# Patient Record
Sex: Female | Born: 1970
Health system: Southern US, Community
[De-identification: ages and names within clinical notes are randomized; demographics above are authoritative.]

## PROBLEM LIST (undated history)

## (undated) DIAGNOSIS — N189 Chronic kidney disease, unspecified: Secondary | ICD-10-CM

## (undated) DIAGNOSIS — F988 Other specified behavioral and emotional disorders with onset usually occurring in childhood and adolescence: Secondary | ICD-10-CM

## (undated) HISTORY — PX: APPENDECTOMY: SHX54

---
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
## 1997-08-25 ENCOUNTER — Inpatient Hospital Stay (HOSPITAL_COMMUNITY): Admission: AD | Admit: 1997-08-25 | Discharge: 1997-08-25 | Payer: Self-pay | Admitting: Obstetrics and Gynecology

## 1997-08-26 ENCOUNTER — Inpatient Hospital Stay (HOSPITAL_COMMUNITY): Admission: AD | Admit: 1997-08-26 | Discharge: 1997-08-28 | Payer: Self-pay | Admitting: Obstetrics and Gynecology

## 1997-09-27 ENCOUNTER — Other Ambulatory Visit: Admission: RE | Admit: 1997-09-27 | Discharge: 1997-09-27 | Payer: Self-pay | Admitting: Obstetrics and Gynecology

## 1998-10-13 ENCOUNTER — Other Ambulatory Visit: Admission: RE | Admit: 1998-10-13 | Discharge: 1998-10-13 | Payer: Self-pay | Admitting: Obstetrics and Gynecology

## 1999-11-28 ENCOUNTER — Other Ambulatory Visit: Admission: RE | Admit: 1999-11-28 | Discharge: 1999-11-28 | Payer: Self-pay | Admitting: Obstetrics and Gynecology

## 2001-04-07 ENCOUNTER — Other Ambulatory Visit: Admission: RE | Admit: 2001-04-07 | Discharge: 2001-04-07 | Payer: Self-pay | Admitting: Obstetrics and Gynecology

## 2002-10-07 ENCOUNTER — Other Ambulatory Visit: Admission: RE | Admit: 2002-10-07 | Discharge: 2002-10-07 | Payer: Self-pay | Admitting: Obstetrics and Gynecology

## 2003-12-06 ENCOUNTER — Other Ambulatory Visit: Admission: RE | Admit: 2003-12-06 | Discharge: 2003-12-06 | Payer: Self-pay | Admitting: Obstetrics and Gynecology

## 2004-09-04 ENCOUNTER — Inpatient Hospital Stay (HOSPITAL_COMMUNITY): Admission: AD | Admit: 2004-09-04 | Discharge: 2004-09-06 | Payer: Self-pay | Admitting: Obstetrics and Gynecology

## 2004-10-13 ENCOUNTER — Other Ambulatory Visit: Admission: RE | Admit: 2004-10-13 | Discharge: 2004-10-13 | Payer: Self-pay | Admitting: Obstetrics and Gynecology

## 2007-03-31 ENCOUNTER — Inpatient Hospital Stay (HOSPITAL_COMMUNITY): Admission: AD | Admit: 2007-03-31 | Discharge: 2007-03-31 | Payer: Self-pay | Admitting: Obstetrics and Gynecology

## 2007-08-05 ENCOUNTER — Inpatient Hospital Stay (HOSPITAL_COMMUNITY): Admission: AD | Admit: 2007-08-05 | Discharge: 2007-08-05 | Payer: Self-pay | Admitting: Obstetrics and Gynecology

## 2007-08-06 ENCOUNTER — Inpatient Hospital Stay (HOSPITAL_COMMUNITY): Admission: AD | Admit: 2007-08-06 | Discharge: 2007-08-07 | Payer: Self-pay | Admitting: Obstetrics and Gynecology

## 2007-08-06 ENCOUNTER — Encounter: Payer: Self-pay | Admitting: Obstetrics and Gynecology

## 2007-09-30 ENCOUNTER — Inpatient Hospital Stay (HOSPITAL_COMMUNITY): Admission: AD | Admit: 2007-09-30 | Discharge: 2007-10-01 | Payer: Self-pay | Admitting: Obstetrics and Gynecology

## 2007-10-23 ENCOUNTER — Ambulatory Visit (HOSPITAL_COMMUNITY): Admission: RE | Admit: 2007-10-23 | Discharge: 2007-10-23 | Payer: Self-pay | Admitting: Urology

## 2008-04-23 HISTORY — PX: ABLATION: SHX5711

## 2009-03-10 ENCOUNTER — Encounter: Payer: Self-pay | Admitting: Emergency Medicine

## 2009-03-10 ENCOUNTER — Ambulatory Visit: Payer: Self-pay | Admitting: Radiology

## 2009-03-10 ENCOUNTER — Observation Stay (HOSPITAL_COMMUNITY): Admission: RE | Admit: 2009-03-10 | Discharge: 2009-03-11 | Payer: Self-pay | Admitting: General Surgery

## 2009-03-11 ENCOUNTER — Encounter (INDEPENDENT_AMBULATORY_CARE_PROVIDER_SITE_OTHER): Payer: Self-pay | Admitting: General Surgery

## 2010-07-26 LAB — CBC
MCHC: 34 g/dL (ref 30.0–36.0)
MCV: 88.9 fL (ref 78.0–100.0)
Platelets: 233 10*3/uL (ref 150–400)
RDW: 12.1 % (ref 11.5–15.5)

## 2010-07-26 LAB — COMPREHENSIVE METABOLIC PANEL
AST: 19 U/L (ref 0–37)
Albumin: 4.3 g/dL (ref 3.5–5.2)
Calcium: 9.2 mg/dL (ref 8.4–10.5)
Creatinine, Ser: 1.1 mg/dL (ref 0.4–1.2)
GFR calc Af Amer: >60 mL/min (ref >60)
GFR calc non Af Amer: 56 mL/min — ABNORMAL LOW (ref >60)
Total Protein: 7.1 g/dL (ref 6.0–8.3)

## 2010-07-26 LAB — URINALYSIS, ROUTINE W REFLEX MICROSCOPIC
Glucose, UA: NEGATIVE mg/dL
Hgb urine dipstick: NEGATIVE
Protein, ur: NEGATIVE mg/dL
Specific Gravity, Urine: 1.018 (ref 1.005–1.030)

## 2010-07-26 LAB — RPR: RPR Ser Ql: NONREACTIVE

## 2010-07-26 LAB — DIFFERENTIAL
Basophils Relative: 6 % — ABNORMAL HIGH (ref 0–1)
Eosinophils Relative: 0 % (ref 0–5)
Lymphs Abs: 1 10*3/uL (ref 0.7–4.0)
Monocytes Relative: 6 % (ref 3–12)
Neutro Abs: 14.1 10*3/uL — ABNORMAL HIGH (ref 1.7–7.7)
Smear Review: NORMAL

## 2010-07-26 LAB — GC/CHLAMYDIA PROBE AMP, GENITAL: Chlamydia, DNA Probe: NEGATIVE

## 2010-07-26 LAB — WET PREP, GENITAL

## 2010-08-23 ENCOUNTER — Emergency Department (HOSPITAL_BASED_OUTPATIENT_CLINIC_OR_DEPARTMENT_OTHER)
Admission: EM | Admit: 2010-08-23 | Discharge: 2010-08-23 | Disposition: A | Discharge status: Home / Self Care | Payer: BC Managed Care – PPO | Attending: Emergency Medicine | Admitting: Emergency Medicine

## 2010-08-23 ENCOUNTER — Emergency Department (INDEPENDENT_AMBULATORY_CARE_PROVIDER_SITE_OTHER): Payer: BC Managed Care – PPO

## 2010-08-23 DIAGNOSIS — Y92009 Unspecified place in unspecified non-institutional (private) residence as the place of occurrence of the external cause: Secondary | ICD-10-CM | POA: Insufficient documentation

## 2010-08-23 DIAGNOSIS — M7989 Other specified soft tissue disorders: Secondary | ICD-10-CM

## 2010-08-23 DIAGNOSIS — M79609 Pain in unspecified limb: Secondary | ICD-10-CM

## 2010-08-23 DIAGNOSIS — W108XXA Fall (on) (from) other stairs and steps, initial encounter: Secondary | ICD-10-CM | POA: Insufficient documentation

## 2010-08-23 DIAGNOSIS — S90129A Contusion of unspecified lesser toe(s) without damage to nail, initial encounter: Secondary | ICD-10-CM | POA: Insufficient documentation

## 2010-09-01 ENCOUNTER — Other Ambulatory Visit: Payer: Self-pay | Admitting: Obstetrics and Gynecology

## 2010-09-05 NOTE — Discharge Summary (Signed)
Lisa Hayes, Lisa Hayes                   ACCOUNT NO.:  [id number]   MEDICAL RECORD NO.:  [id number]          PATIENT TYPE:  INP   LOCATION:  9159                          FACILITY:  [facility]   PHYSICIAN:  Juluis Mire, M.D.   DATE OF BIRTH:  [birth year]   DATE OF ADMISSION:  08/06/2007  DATE OF DISCHARGE:  08/07/2007                               DISCHARGE SUMMARY   ADMITTING DIAGNOSES:  Intrauterine pregnancy at 58 and 4/7th with right  lower quadrant pain.   DISCHARGE DIAGNOSES:  Intrauterine pregnancy at 36 and 4/7th with right  lower quadrant pain.   For complete history and physical, see dictated note.   COURSE IN THE HOSPITAL:  The patient was brought in.  She underwent an  MRI.  There was no evidence that it was an appendicitis.  She had an  obstetrical ultrasound that was unremarkable.  She had a biophysical  profile 8/8.  Subsequently, was kept in the hospital overnight.  Her  white count went from 19,000 to 16,000.  She remained afebrile with  stable vital signs.  On the morning of August 07, 2007, she again was  afebrile and tolerating her diet.  She states the pain had improved.  She still had some right lower quadrant tenderness over the uterus with  no peritoneal signs.  Bowel sounds were active.  She was having normal  bowel function and normal bladder function.  The only thing noted on the  MRI was slight hydronephrosis on the right, could be pregnancy related.  We are going to discharge her home at this time.   In terms of complications, none was indicated during her stay in the  hospital.  The patient was discharged in stable condition.   DISPOSITION:  We had going to go ahead and send her home on a course of  Augmentin for possible inflammatory causes related to the kidney and  bladder.  She is also sent home with Tylox needed for pain.  She is  instructed to call with increasing fever, pain, nausea, vomiting, or any  change in symptomatology.  We will plan to follow her  up in the office  tomorrow.      Juluis Mire, M.D.  Electronically Signed     JSM/MEDQ  D:  08/07/2007  T:  08/07/2007  Job:  [id number]

## 2010-09-08 NOTE — Op Note (Signed)
Lisa Hayes, Lisa Hayes                   ACCOUNT NO.:  [id number]   MEDICAL RECORD NO.:  [id number]          PATIENT TYPE:  INP   LOCATION:  9174                          FACILITY:  [facility]   PHYSICIAN:  Guy Sandifer. Henderson Cloud, M.D. DATE OF BIRTH:  1970/06/26   DATE OF PROCEDURE:  09/05/2004  DATE OF DISCHARGE:                                 OPERATIVE REPORT   PROCEDURE:  Vacuum extraction.   INDICATIONS AND CONSENT:  This patient is a 40 year old married white  female, G3, P2, EDC of Sep 08, 2004, placing her at 39-1/2 weeks' estimated  gestational age.  She presents to labor and delivery for a scheduled two-  stage induction.  She is having spontaneous contractions.  Epidural is  placed.  Spontaneous rupture of membranes for clear fluid is noted.  When  she progresses to complete dilation, she has a one to two-minute  deceleration in the 50s to 60s.  Oxygen is applied.  Heart rate recovers and  the baby is reactive.  She begins the second stage.  She pushes to a +3  station.  Occiput anterior.  Heart rates in the 50s to the 60s.  Vacuum  extraction with a one in 40,000 chance of severe morbidity or mortality is  discussed with the patient and her husband to shorten the second stage in  view of the bradycardia.  The Foley catheter has just been removed.  The  Kiwi vacuum extractor was placed on the vertex.  Over the course of one  contraction with no pop-offs and an easy pull, the vertex is delivered.  The  oropharynx and nasopharynx are suctioned.  A nuchal cord x1 is reduced.  The  remainder of the baby is delivered.  A good cry and tone is noted.  A viable  female infant, Apgars of 8 and 9 at one and five minutes, respectively, is  obtained.  Arterial cord pH of 7.20 is noted, and a birth weight is pending.  Placenta is three vessels and intact.  Cervix and vagina are without  laceration.  A second degree midline episiotomy is repaired.  The patient  and infant are stable in the labor and delivery  room.      JET/MEDQ  D:  09/05/2004  T:  09/05/2004  Job:  [id number]

## 2010-09-08 NOTE — H&P (Signed)
Lisa Hayes, FAIDLEY                   ACCOUNT NO.:  [id number]   MEDICAL RECORD NO.:  [id number]           PATIENT TYPE:   LOCATION:                                 FACILITY:   PHYSICIAN:  Duke Salvia. Marcelle Overlie, M.D.    DATE OF BIRTH:   DATE OF ADMISSION:  DATE OF DISCHARGE:                                HISTORY & PHYSICAL   CHIEF COMPLAINT:  For labor induction at term.   HISTORY OF PRESENT ILLNESS:  A 40 year old, G3, P2-0-0-2, EDD Sep 08, 2004,  who presents at term with favorable cervix for induction.  She has had an  uncomplicated prenatal course, history of positive group B strep in the  past.  She has had mild gestational hypertension.  Recently, her NST and BPP  were normal, dated Aug 24, 2004.   PAST MEDICAL HISTORY:  Blood type is O positive, rubella titer is immune.   OBSTETRICAL HISTORY:  She has had 2 vaginal deliveries at term, the first  complicated by PIH.   REVIEW OF SYSTEMS:  Significant for pyelonephritis in 1990.   FAMILY HISTORY:  Heart disease.  Father has had an MI.  Both parents have  hypertension.   PHYSICAL EXAMINATION:  VITAL SIGNS:  Temperature 98.2, blood pressure  130/70.  HEENT:  Unremarkable.  NECK:  Supple without masses.  LUNGS:  Clear.  CARDIOVASCULAR:  Regular rate and rhythm without murmurs, rubs, or gallops  noted.  BREASTS:  Not examined.  ABDOMEN:  Term fundal height.  Fetal heart rate 140s.  PELVIC:  Cervix was 2, 50%, -3, vertex.  EXTREMITIES:  1+ edema.  Reflexes 1 to 2+.  No clonus.   IMPRESSION:  1.  Term intrauterine pregnancy.  2.  Mild gestational hypertension.   PLAN:  Will admit for 2-stage labor induction.  Will require prophylactic  antibiotics in labor due to her group B strep history.      RMH/MEDQ  D:  09/04/2004  T:  09/04/2004  Job:  [id number]

## 2011-01-16 LAB — LACTATE DEHYDROGENASE: LDH: 100

## 2011-01-16 LAB — COMPREHENSIVE METABOLIC PANEL
AST: 15
Albumin: 2.9 — ABNORMAL LOW
BUN: 7
CO2: 23
Calcium: 8.8
Creatinine, Ser: 0.8
GFR calc Af Amer: >60
GFR calc non Af Amer: >60

## 2011-01-16 LAB — CBC
Hemoglobin: 10.5 — ABNORMAL LOW
MCHC: 35
MCHC: 35.9
MCV: 91.2
Platelets: 155
RBC: 3.26 — ABNORMAL LOW
WBC: 16 — ABNORMAL HIGH

## 2011-01-16 LAB — DIFFERENTIAL
Eosinophils Relative: 0
Lymphocytes Relative: 5 — ABNORMAL LOW
Lymphocytes Relative: 7 — ABNORMAL LOW
Lymphs Abs: 0.9
Lymphs Abs: 1.1
Monocytes Absolute: 0.9
Monocytes Relative: 6
Neutro Abs: 13.9 — ABNORMAL HIGH
Neutro Abs: 17.7 — ABNORMAL HIGH
Neutrophils Relative %: 87 — ABNORMAL HIGH

## 2011-01-16 LAB — URINALYSIS, ROUTINE W REFLEX MICROSCOPIC
Bilirubin Urine: NEGATIVE
Glucose, UA: NEGATIVE
Hgb urine dipstick: NEGATIVE
Protein, ur: NEGATIVE
Urobilinogen, UA: 0.2
pH: 5.5

## 2011-01-16 LAB — URINE CULTURE: Colony Count: >=100000

## 2011-01-16 LAB — URIC ACID: Uric Acid, Serum: 4

## 2011-01-18 LAB — CBC
HCT: 28.9 — ABNORMAL LOW
HCT: 32 — ABNORMAL LOW
HCT: 35.6 — ABNORMAL LOW
Hemoglobin: 12.6
MCHC: 35.4
MCV: 89.4
MCV: 90.1
Platelets: 119 — ABNORMAL LOW
Platelets: 127 — ABNORMAL LOW
RBC: 3.95
RDW: 14
RDW: 14.1
RDW: 14.5
WBC: 16.9 — ABNORMAL HIGH
WBC: 18.6 — ABNORMAL HIGH

## 2011-01-18 LAB — RPR: RPR Ser Ql: NONREACTIVE

## 2011-01-18 LAB — COMPREHENSIVE METABOLIC PANEL
ALT: 12
BUN: 7
CO2: 24
Calcium: 8.5
GFR calc non Af Amer: >60
Glucose, Bld: 71
Total Protein: 5.5 — ABNORMAL LOW

## 2011-01-18 LAB — URINALYSIS, ROUTINE W REFLEX MICROSCOPIC
Glucose, UA: NEGATIVE
Hgb urine dipstick: NEGATIVE
Ketones, ur: NEGATIVE
Protein, ur: NEGATIVE
Urobilinogen, UA: 0.2

## 2011-01-18 LAB — LACTATE DEHYDROGENASE: LDH: 131

## 2011-01-29 LAB — CBC
HCT: 39.8
Hemoglobin: 14
RBC: 4.57
RDW: 12.5
WBC: 11 — ABNORMAL HIGH

## 2011-01-29 LAB — URINALYSIS, ROUTINE W REFLEX MICROSCOPIC
Nitrite: NEGATIVE
Specific Gravity, Urine: 1.025
Urobilinogen, UA: 0.2
pH: 6

## 2011-01-29 LAB — BASIC METABOLIC PANEL
Calcium: 9.2
GFR calc Af Amer: >60
GFR calc non Af Amer: >60
Glucose, Bld: 92
Potassium: 4
Sodium: 134 — ABNORMAL LOW

## 2012-08-13 ENCOUNTER — Other Ambulatory Visit: Payer: Self-pay

## 2014-07-21 ENCOUNTER — Other Ambulatory Visit: Payer: Self-pay | Admitting: Obstetrics and Gynecology

## 2014-07-23 LAB — CYTOLOGY - PAP

## 2015-11-12 ENCOUNTER — Emergency Department (HOSPITAL_BASED_OUTPATIENT_CLINIC_OR_DEPARTMENT_OTHER): Payer: BLUE CROSS/BLUE SHIELD

## 2015-11-12 ENCOUNTER — Emergency Department (HOSPITAL_BASED_OUTPATIENT_CLINIC_OR_DEPARTMENT_OTHER)
Admission: EM | Admit: 2015-11-12 | Discharge: 2015-11-12 | Disposition: A | Discharge status: Home / Self Care | Payer: BLUE CROSS/BLUE SHIELD | Attending: Emergency Medicine | Admitting: Emergency Medicine

## 2015-11-12 ENCOUNTER — Encounter (HOSPITAL_BASED_OUTPATIENT_CLINIC_OR_DEPARTMENT_OTHER): Payer: Self-pay | Admitting: *Deleted

## 2015-11-12 DIAGNOSIS — Z79899 Other long term (current) drug therapy: Secondary | ICD-10-CM | POA: Diagnosis not present

## 2015-11-12 DIAGNOSIS — N2889 Other specified disorders of kidney and ureter: Secondary | ICD-10-CM | POA: Diagnosis not present

## 2015-11-12 DIAGNOSIS — R109 Unspecified abdominal pain: Secondary | ICD-10-CM | POA: Diagnosis present

## 2015-11-12 DIAGNOSIS — N159 Renal tubulo-interstitial disease, unspecified: Secondary | ICD-10-CM

## 2015-11-12 LAB — COMPREHENSIVE METABOLIC PANEL
ALBUMIN: 3.3 g/dL — AB (ref 3.5–5.0)
ALT: 19 U/L (ref 14–54)
AST: 16 U/L (ref 15–41)
Alkaline Phosphatase: 69 U/L (ref 38–126)
Anion gap: 8 (ref 5–15)
BILIRUBIN TOTAL: 1 mg/dL (ref 0.3–1.2)
BUN: 10 mg/dL (ref 6–20)
CHLORIDE: 101 mmol/L (ref 101–111)
CO2: 25 mmol/L (ref 22–32)
CREATININE: 1.16 mg/dL — AB (ref 0.44–1.00)
Calcium: 8.6 mg/dL — ABNORMAL LOW (ref 8.9–10.3)
GFR calc Af Amer: >60 mL/min (ref >60)
GFR, EST NON AFRICAN AMERICAN: 56 mL/min — AB (ref >60)
GLUCOSE: 102 mg/dL — AB (ref 65–99)
Potassium: 3.7 mmol/L (ref 3.5–5.1)
Sodium: 134 mmol/L — ABNORMAL LOW (ref 135–145)
Total Protein: 6.6 g/dL (ref 6.5–8.1)

## 2015-11-12 LAB — URINALYSIS, ROUTINE W REFLEX MICROSCOPIC
GLUCOSE, UA: NEGATIVE mg/dL
KETONES UR: 40 mg/dL — AB
Nitrite: NEGATIVE
PH: 6.5 (ref 5.0–8.0)
Protein, ur: 100 mg/dL — AB
Specific Gravity, Urine: 1.027 (ref 1.005–1.030)

## 2015-11-12 LAB — URINE MICROSCOPIC-ADD ON

## 2015-11-12 LAB — CBC WITH DIFFERENTIAL/PLATELET
BASOS ABS: 0 10*3/uL (ref 0.0–0.1)
BASOS PCT: 0 %
Eosinophils Absolute: 0 10*3/uL (ref 0.0–0.7)
Eosinophils Relative: 0 %
HEMATOCRIT: 38.6 % (ref 36.0–46.0)
Hemoglobin: 13.2 g/dL (ref 12.0–15.0)
LYMPHS PCT: 6 %
Lymphs Abs: 0.8 10*3/uL (ref 0.7–4.0)
MCH: 30.6 pg (ref 26.0–34.0)
MCHC: 34.2 g/dL (ref 30.0–36.0)
MCV: 89.6 fL (ref 78.0–100.0)
Monocytes Absolute: 1.2 10*3/uL — ABNORMAL HIGH (ref 0.1–1.0)
Monocytes Relative: 9 %
NEUTROS ABS: 11.1 10*3/uL — AB (ref 1.7–7.7)
NEUTROS PCT: 85 %
Platelets: 186 10*3/uL (ref 150–400)
RBC: 4.31 MIL/uL (ref 3.87–5.11)
RDW: 12.3 % (ref 11.5–15.5)
WBC: 13.1 10*3/uL — AB (ref 4.0–10.5)

## 2015-11-12 LAB — LIPASE, BLOOD: LIPASE: 14 U/L (ref 11–51)

## 2015-11-12 LAB — PREGNANCY, URINE: Preg Test, Ur: NEGATIVE

## 2015-11-12 MED ORDER — MORPHINE SULFATE (PF) 4 MG/ML IV SOLN
4.0000 mg | Freq: Once | INTRAVENOUS | Status: AC
  Administered 2015-11-12: 4 mg via INTRAVENOUS
  Filled 2015-11-12: qty 1

## 2015-11-12 MED ORDER — KETOROLAC TROMETHAMINE 30 MG/ML IJ SOLN
30.0000 mg | Freq: Once | INTRAMUSCULAR | Status: AC
  Administered 2015-11-12: 30 mg via INTRAVENOUS
  Filled 2015-11-12: qty 1

## 2015-11-12 MED ORDER — SODIUM CHLORIDE 0.9 % IV BOLUS (SEPSIS)
1000.0000 mL | Freq: Once | INTRAVENOUS | Status: AC
  Administered 2015-11-12: 1000 mL via INTRAVENOUS

## 2015-11-12 MED ORDER — DEXTROSE 5 % IV SOLN
1.0000 g | Freq: Once | INTRAVENOUS | Status: AC
  Administered 2015-11-12: 1 g via INTRAVENOUS
  Filled 2015-11-12: qty 10

## 2015-11-12 MED ORDER — SULFAMETHOXAZOLE-TRIMETHOPRIM 800-160 MG PO TABS: 1.0000 | ORAL_TABLET | Freq: Two times a day (BID) | ORAL | Status: AC

## 2015-11-12 NOTE — ED Provider Notes (Signed)
CSN: [id number]     Arrival date & time 11/12/15  1315 History   First MD Initiated Contact with Patient 11/12/15 1340     Chief Complaint  Patient presents with  . Flank Pain     (Consider location/radiation/quality/duration/timing/severity/associated sxs/prior Treatment) HPI   Patient is a 45 year old female who presents to the ED with right flank pain, onset 5 days. Patient reports 6 days ago she began having dysuria and notes the following day she began to have right-sided abdominal pain radiating around to her right flank. Patient reports having a waxing and waning sharp pain, pain is worse with movement. Patient reports having similar pain in the past when she was diagnosed with a kidney infection over 8 years ago. She notes she was seen by a nurse at her work 4 days ago, was diagnosed with a UTI and started on Cipro. She states she has been taking 500 mg of Cipro twice daily for the past 3-1/2 days. Patient endorses associated hematuria, urinary frequency, nausea and fever (temp 100.9 at home). Patient states she has been taking ibuprofen and an old prescription of hydrocodone at home with mild intermittent relief. Denies chills, cough, shortness of breath, chest pain, vomiting, diarrhea, constipation, vaginal bleeding, vaginal discharge, blood in stool, urinary retention. Endorses history of appendectomy and endometrial ablation. Patient reports her last menstrual cycle was in 2010 prior to having her endometrial ablation performed.  History reviewed. No pertinent past medical history. History reviewed. No pertinent past surgical history. History reviewed. No pertinent family history. Social History  Substance Use Topics  . Smoking status: Never Smoker   . Smokeless tobacco: None  . Alcohol Use: None   OB History    No data available     Review of Systems  Constitutional: Positive for fever.  Gastrointestinal: Positive for nausea and abdominal pain.  Genitourinary: Positive for  dysuria, frequency, hematuria and flank pain (right).  All other systems reviewed and are negative.     Allergies  Review of patient's allergies indicates no known allergies.  Home Medications   Prior to Admission medications   Medication Sig Start Date End Date Taking? Authorizing Provider  lisdexamfetamine (VYVANSE) 40 MG capsule Take 40 mg by mouth every morning.   Yes Historical Provider, MD  sulfamethoxazole-trimethoprim (BACTRIM DS,SEPTRA DS) 800-160 MG tablet Take 1 tablet by mouth 2 (two) times daily. 11/12/15 11/19/15  Satira Sark Nadeau, PA-C   BP 110/57 mmHg  Pulse 106  Temp(Src) 98.4 F (36.9 C) (Oral)  Resp 18  Ht  (1.727 m)  Wt 83.915 kg  BMI 28.14 kg/m2  SpO2 99% Physical Exam  Constitutional: She is oriented to person, place, and time. She appears well-developed and well-nourished. No distress.  HENT:  Head: Normocephalic and atraumatic.  Mouth/Throat: Oropharynx is clear and moist. No oropharyngeal exudate.  Eyes: Conjunctivae and EOM are normal. Right eye exhibits no discharge. Left eye exhibits no discharge. No scleral icterus.  Neck: Normal range of motion. Neck supple.  Cardiovascular: Normal rate, regular rhythm, normal heart sounds and intact distal pulses.   HR 88  Pulmonary/Chest: Effort normal and breath sounds normal. No respiratory distress. She has no wheezes. She has no rales. She exhibits no tenderness.  Abdominal: Soft. Bowel sounds are normal. She exhibits no distension and no mass. There is tenderness in the right upper quadrant and right lower quadrant. There is CVA tenderness (right). There is no rigidity, no rebound and no guarding.  Musculoskeletal: Normal range of motion. She exhibits  no edema.  Neurological: She is alert and oriented to person, place, and time.  Skin: Skin is warm and dry. She is not diaphoretic.  Nursing note and vitals reviewed.   ED Course  Procedures (including critical care time) Labs Review Labs  Reviewed  URINALYSIS, ROUTINE W REFLEX MICROSCOPIC (NOT AT [facility]) - Abnormal; Notable for the following:    Color, Urine AMBER (*)    APPearance CLOUDY (*)    Hgb urine dipstick LARGE (*)    Bilirubin Urine SMALL (*)    Ketones, ur 40 (*)    Protein, ur 100 (*)    Leukocytes, UA LARGE (*)    All other components within normal limits  URINE MICROSCOPIC-ADD ON - Abnormal; Notable for the following:    Squamous Epithelial / LPF 0-5 (*)    Bacteria, UA MANY (*)    All other components within normal limits  CBC WITH DIFFERENTIAL/PLATELET - Abnormal; Notable for the following:    WBC 13.1 (*)    Neutro Abs 11.1 (*)    Monocytes Absolute 1.2 (*)    All other components within normal limits  COMPREHENSIVE METABOLIC PANEL - Abnormal; Notable for the following:    Sodium 134 (*)    Glucose, Bld 102 (*)    Creatinine, Ser 1.16 (*)    Calcium 8.6 (*)    Albumin 3.3 (*)    GFR calc non Af Amer 56 (*)    All other components within normal limits  URINE CULTURE  PREGNANCY, URINE  LIPASE, BLOOD    Imaging Review Ct Renal Stone Study  11/12/2015  CLINICAL DATA:  Patient with right flank pain and hematuria for 2 days. EXAM: CT ABDOMEN AND PELVIS WITHOUT CONTRAST TECHNIQUE: Multidetector CT imaging of the abdomen and pelvis was performed following the standard protocol without IV contrast. COMPARISON:  CT abdomen pelvis 03/10/2009. FINDINGS: Lower chest: Normal heart size. Subpleural ground-glass opacities within the bilateral lower lobes compatible with atelectasis. No pleural effusion. Hepatobiliary: Liver is normal in size and contour. Liver is diffusely low in attenuation compatible with hepatic steatosis. Gallbladder is unremarkable. No intrahepatic or extrahepatic biliary dilatation. Pancreas: Unremarkable Spleen: Unremarkable Adrenals/Urinary Tract: Normal adrenal glands. The left kidney is normal in size and contour. No left-sided hydronephrosis. Urinary bladder is decompressed. The right  kidney is atrophic. There is marked dilatation of the right renal collecting system. Extensive fat stranding surrounding the right renal collecting system, right kidney and right ureter. No right-sided ureterolithiasis identified. Stomach/Bowel: No abnormal bowel wall thickening or evidence for bowel obstruction. No free intraperitoneal air. Normal morphology of the stomach. Vascular/Lymphatic: Normal caliber abdominal aorta. No retroperitoneal lymphadenopathy. Other: Uterus adnexal structures are unremarkable. Small amount of free fluid in the pelvis. Musculoskeletal: No aggressive or acute appearing osseous lesions. IMPRESSION: There is marked dilatation of the right renal collecting system, suggestive of UPJ obstruction. There is a significant amount of fat stranding about the right renal collecting system, right kidney and right ureter raising the possibility of superimposed infectious process. Causative etiology for the UPJ obstruction is not identified on current examination. Transitional cell carcinoma needs to be excluded as a causative etiology with multi phase contrast-enhanced CT as well as urinalysis. Hepatic steatosis. Electronically Signed   By: Annia Belt M.D.   On: 11/12/2015 15:49   I have personally reviewed and evaluated these images and lab results as part of my medical decision-making.   EKG Interpretation None      MDM   Final diagnoses:  Obstruction  of kidney  Kidney infection    Patient presents with dysuria and associated right flank and right-sided abdominal pain for the past 5 days. Endorses associated fever and hematuria. Patient was diagnosed with UTI 3 days ago and started on Cipro. She reports having worsening right flank pain after starting antibiotic. VSS. Exam revealed mild right upper and lower quadrant abdominal pain, right CVA tenderness, no peritoneal signs. Remaining exam unremarkable. Patient given IV fluids and Toradol in the ED. Urine revealed large  hemoglobin, large leukocytes, too numerous to count WBCs and many bacteria; consistent with infection. Pregnancy negative. WBC 13.1. Cr mildly elevated at 1.13 (baseline 1.1-0.79). On reevaluation patient reports her pain has mildly improved. CT renal study showed UPJ obstruction with stranding and right renal collecting system suggesting superimposed infectious process. Consult to urology. Dr. Mena Goes advised to give patient Rocephin in the ED and discharge patient home on Bactrim with plan to D/C Cipro. Plan to have patient call urology clinic Monday morning to schedule a follow-up appointment for next week. Patient able to tolerate  PO.   Evaluation does not show pathology requring ongoing emergent intervention or admission. Pt is hemodynamically stable and mentating appropriately. Discussed findings/results and plan with patient/guardian, who agrees with plan. All questions answered. Strict return precautions discussed and outpatient follow up given.      Satira Sark [place], [place] 11/12/15 1652   Nelva Nay, MD 11/13/15 [phone]

## 2015-11-12 NOTE — ED Notes (Signed)
Pt reports right flank pain and dysuria x Tuesday evening.

## 2015-11-12 NOTE — Discharge Instructions (Signed)
Take your antibiotic prescription as prescribed until finished.. Taking your prescription of Ciprofloxacin. Continue drinking fluids to remain hydrated at home. Call the Urology clinic listed above on Monday to schedule a follow up appointment. Please return to the Emergency Department if symptoms worsen or new onset of fever, worsening abdominal pain, vomiting, unable to keep fluids down, unable to urinate, blood in urine.

## 2015-11-14 LAB — URINE CULTURE: CULTURE: NO GROWTH

## 2015-11-15 ENCOUNTER — Emergency Department (HOSPITAL_BASED_OUTPATIENT_CLINIC_OR_DEPARTMENT_OTHER)
Admission: EM | Admit: 2015-11-15 | Discharge: 2015-11-15 | Disposition: A | Discharge status: Home / Self Care | Payer: BLUE CROSS/BLUE SHIELD | Attending: Emergency Medicine | Admitting: Emergency Medicine

## 2015-11-15 ENCOUNTER — Encounter (HOSPITAL_BASED_OUTPATIENT_CLINIC_OR_DEPARTMENT_OTHER): Payer: Self-pay | Admitting: *Deleted

## 2015-11-15 DIAGNOSIS — M549 Dorsalgia, unspecified: Secondary | ICD-10-CM | POA: Insufficient documentation

## 2015-11-15 DIAGNOSIS — R509 Fever, unspecified: Secondary | ICD-10-CM | POA: Insufficient documentation

## 2015-11-15 DIAGNOSIS — R109 Unspecified abdominal pain: Secondary | ICD-10-CM | POA: Diagnosis not present

## 2015-11-15 LAB — URINALYSIS, ROUTINE W REFLEX MICROSCOPIC
Bilirubin Urine: NEGATIVE
GLUCOSE, UA: NEGATIVE mg/dL
Hgb urine dipstick: NEGATIVE
KETONES UR: NEGATIVE mg/dL
LEUKOCYTES UA: NEGATIVE
NITRITE: NEGATIVE
PH: 6 (ref 5.0–8.0)
Protein, ur: NEGATIVE mg/dL
Specific Gravity, Urine: 1.022 (ref 1.005–1.030)

## 2015-11-15 LAB — BASIC METABOLIC PANEL
ANION GAP: 8 (ref 5–15)
BUN: 12 mg/dL (ref 6–20)
CALCIUM: 8.5 mg/dL — AB (ref 8.9–10.3)
CO2: 23 mmol/L (ref 22–32)
Chloride: 104 mmol/L (ref 101–111)
Creatinine, Ser: 0.96 mg/dL (ref 0.44–1.00)
Glucose, Bld: 84 mg/dL (ref 65–99)
POTASSIUM: 3.9 mmol/L (ref 3.5–5.1)
Sodium: 135 mmol/L (ref 135–145)

## 2015-11-15 LAB — CBC WITH DIFFERENTIAL/PLATELET
BLASTS: 0 %
Band Neutrophils: 3 %
Basophils Absolute: 0.1 10*3/uL (ref 0.0–0.1)
Basophils Relative: 1 %
Eosinophils Absolute: 0.1 10*3/uL (ref 0.0–0.7)
Eosinophils Relative: 1 %
HEMATOCRIT: 37.9 % (ref 36.0–46.0)
Hemoglobin: 12.9 g/dL (ref 12.0–15.0)
LYMPHS ABS: 1.5 10*3/uL (ref 0.7–4.0)
LYMPHS PCT: 18 %
MCH: 30.3 pg (ref 26.0–34.0)
MCHC: 34 g/dL (ref 30.0–36.0)
MCV: 89 fL (ref 78.0–100.0)
MONOS PCT: 10 %
Metamyelocytes Relative: 1 %
Monocytes Absolute: 0.8 10*3/uL (ref 0.1–1.0)
Myelocytes: 0 %
NEUTROS ABS: 5.9 10*3/uL (ref 1.7–7.7)
NEUTROS PCT: 66 %
NRBC: 0 /100{WBCs}
PROMYELOCYTES ABS: 0 %
Platelets: 247 10*3/uL (ref 150–400)
RBC: 4.26 MIL/uL (ref 3.87–5.11)
RDW: 12.2 % (ref 11.5–15.5)
WBC: 8.4 10*3/uL (ref 4.0–10.5)

## 2015-11-15 LAB — PREGNANCY, URINE: Preg Test, Ur: NEGATIVE

## 2015-11-15 MED ORDER — POLYETHYLENE GLYCOL 3350 17 GM/SCOOP PO POWD: 17.0000 g | Freq: Two times a day (BID) | ORAL | 1 refills | Status: DC

## 2015-11-15 MED ORDER — ONDANSETRON 4 MG PO TBDP: 4.0000 mg | ORAL_TABLET | Freq: Three times a day (TID) | ORAL | 0 refills | Status: DC | PRN

## 2015-11-15 MED ORDER — ONDANSETRON HCL 4 MG/2ML IJ SOLN
4.0000 mg | Freq: Once | INTRAMUSCULAR | Status: AC
  Administered 2015-11-15: 4 mg via INTRAVENOUS
  Filled 2015-11-15: qty 2

## 2015-11-15 MED ORDER — MORPHINE SULFATE (PF) 4 MG/ML IV SOLN
4.0000 mg | Freq: Once | INTRAVENOUS | Status: AC
  Administered 2015-11-15: 4 mg via INTRAVENOUS
  Filled 2015-11-15: qty 1

## 2015-11-15 MED ORDER — HYDROCODONE-ACETAMINOPHEN 5-325 MG PO TABS: 1.0000 | ORAL_TABLET | Freq: Four times a day (QID) | ORAL | 0 refills | Status: DC | PRN

## 2015-11-15 MED ORDER — SODIUM CHLORIDE 0.9 % IV BOLUS (SEPSIS)
1000.0000 mL | Freq: Once | INTRAVENOUS | Status: AC
  Administered 2015-11-15: 1000 mL via INTRAVENOUS

## 2015-11-15 MED FILL — HYDROCODON-APAP 5-325: 5-325 | 2 days supply | Qty: 15 | Fill #0

## 2015-11-15 MED FILL — POLYETHYLENE GLYCOL 3350 PO: 14 days supply | Qty: 476 | Fill #0

## 2015-11-15 MED FILL — ONDANSETRON ODT 4 MG TABLET: 4 | 4 days supply | Qty: 9 | Fill #0

## 2015-11-15 NOTE — ED Triage Notes (Signed)
Pt seen here 3 days ago fro same, today c/o lower back pain and no improvement

## 2015-11-15 NOTE — ED Notes (Signed)
Pt made aware to return if symptoms worsen or if any life threatening symptoms occur.  Pt made aware not to drive or go to work while taking narcotic pain medication.    

## 2015-11-15 NOTE — ED Provider Notes (Signed)
[facility] Provider Note   CSN: [id number] Arrival date & time: 11/15/15  1343  First Provider Contact:  First MD Initiated Contact with Patient 11/15/15 1426        History   Chief Complaint Chief Complaint  Patient presents with  . Back Pain    HPI Lisa Hayes is a 45 y.o. female.  Lisa Hayes is a 45 y.o. Female who presents to the ED complaining of ongoing right flank pain. The patient was seen here 3 days prior and diagnosed with a urinary tract infection and UPJ obstruction. Patient reports she has a long history of right kidney issues and has been told by her urologist Dr. Patsi Sears that she has less than 3% right kidney function. Patient reports she has been having 8 days now of right flank pain. She was originally started on Cipro by her nurse at work and then switched to Bactrim after her emergency department visit. She reports she has follow-up with urology tomorrow. She reports she is continue to have right flank pain and this prompted her to come to the emergency department today. She reports she's been taking Tylenol and ibuprofen intermittently with some relief of her pain. She currently complains of 6 out of 10 right flank pain. She denies any dysuria, hematuria, urinary frequency or urinary urgency currently. She has been tolerating by mouth. She reports some low-grade fevers at home. No antipyretics in more than 6 hours. No fever on arrival to the emergency department. Patient denies nausea, vomiting, diarrhea, current urinary symptoms, abdominal pain, coughing or trouble breathing.   The history is provided by the patient and medical records. No language interpreter was used.  Back Pain   Associated symptoms include a fever. Pertinent negatives include no chest pain, no headaches, no abdominal pain and no dysuria.    History reviewed. No pertinent past medical history.  There are no active problems to display for this patient.   History reviewed. No  pertinent surgical history.  OB History    No data available       Home Medications    Prior to Admission medications   Medication Sig Start Date End Date Taking? Authorizing Provider  HYDROcodone-acetaminophen (NORCO/VICODIN) 5-325 MG tablet Take 1-2 tablets by mouth every 6 (six) hours as needed for moderate pain. 11/15/15   Everlene Farrier, PA-C  lisdexamfetamine (VYVANSE) 40 MG capsule Take 40 mg by mouth every morning.    Historical Provider, MD  ondansetron (ZOFRAN ODT) 4 MG disintegrating tablet Take 1 tablet (4 mg total) by mouth every 8 (eight) hours as needed for nausea or vomiting. 11/15/15   Everlene Farrier, PA-C  polyethylene glycol powder (GLYCOLAX/MIRALAX) powder Take 17 g by mouth 2 (two) times daily. Until daily soft stools  OTC 11/15/15   Everlene Farrier, PA-C  sulfamethoxazole-trimethoprim (BACTRIM DS,SEPTRA DS) 800-160 MG tablet Take 1 tablet by mouth 2 (two) times daily. 11/12/15 11/19/15  Barrett Henle, PA-C    Family History No family history on file.  Social History Social History  Substance Use Topics  . Smoking status: Never Smoker  . Smokeless tobacco: Not on file  . Alcohol use Not on file     Allergies   Review of patient's allergies indicates no known allergies.   Review of Systems Review of Systems  Constitutional: Positive for fever. Negative for chills.  HENT: Negative for congestion and sore throat.   Eyes: Negative for visual disturbance.  Respiratory: Negative for cough and shortness of breath.  Cardiovascular: Negative for chest pain.  Gastrointestinal: Negative for abdominal pain, diarrhea, nausea and vomiting.  Genitourinary: Positive for flank pain. Negative for decreased urine volume, difficulty urinating, dysuria, frequency, hematuria, menstrual problem, urgency, vaginal bleeding and vaginal discharge.  Musculoskeletal: Positive for back pain. Negative for neck pain.  Skin: Negative for rash.  Neurological: Negative for  headaches.     Physical Exam Updated Vital Signs BP 115/65 (BP Location: Right Arm)   Pulse 77   Temp 98.5 F (36.9 C) (Oral)   Resp 16   Ht 5\' 8"  (1.727 m)   Wt 81.6 kg   SpO2 100%   BMI 27.37 kg/m   Physical Exam  Constitutional: She appears well-developed and well-nourished. No distress.  Nontoxic appearing.  HENT:  Head: Normocephalic and atraumatic.  Mouth/Throat: Oropharynx is clear and moist.  Eyes: Conjunctivae are normal. Pupils are equal, round, and reactive to light. Right eye exhibits no discharge. Left eye exhibits no discharge.  Neck: Neck supple.  Cardiovascular: Normal rate, regular rhythm, normal heart sounds and intact distal pulses.  Exam reveals no gallop and no friction rub.   No murmur heard. Pulmonary/Chest: Effort normal and breath sounds normal. No respiratory distress. She has no wheezes. She has no rales.  Abdominal: Soft. Bowel sounds are normal. She exhibits no distension and no mass. There is tenderness. There is no rebound and no guarding. No hernia.  Abdomen is soft and nontender to palpation. Bowel sounds are present. Patient has right flank tenderness to palpation. No peritoneal signs.  Musculoskeletal: She exhibits no edema.  Lymphadenopathy:    She has no cervical adenopathy.  Neurological: She is alert. Coordination normal.  Skin: Skin is warm and dry. Capillary refill takes less than 2 seconds. No rash noted. She is not diaphoretic. No erythema. No pallor.  Psychiatric: She has a normal mood and affect. Her behavior is normal.  Nursing note and vitals reviewed.    ED Treatments / Results  Labs (all labs ordered are listed, but only abnormal results are displayed) Labs Reviewed  BASIC METABOLIC PANEL - Abnormal; Notable for the following:       Result Value   Calcium 8.5 (*)    All other components within normal limits  URINALYSIS, ROUTINE W REFLEX MICROSCOPIC (NOT AT [facility])  CBC WITH DIFFERENTIAL/PLATELET  PREGNANCY, URINE     EKG  EKG Interpretation None       Radiology No results found.  Procedures Procedures (including critical care time)  Medications Ordered in ED Medications  sodium chloride 0.9 % bolus 1,000 mL (0 mLs Intravenous Stopped 11/15/15 1707)  ondansetron (ZOFRAN) injection 4 mg (4 mg Intravenous Given 11/15/15 1518)  morphine 4 MG/ML injection 4 mg (4 mg Intravenous Given 11/15/15 1518)     Initial Impression / Assessment and Plan / ED Course  I have reviewed the triage vital signs and the nursing notes.  Pertinent labs & imaging results that were available during my care of the patient were reviewed by me and considered in my medical decision making (see chart for details).  Clinical Course   This s a 45 y.o. Female who presents to the ED complaining of ongoing right flank pain. The patient was seen here 3 days prior and diagnosed with a urinary tract infection and UPJ obstruction. Patient reports she has a long history of right kidney issues and has been told by her urologist Dr. Patsi Sears that she has less than 3% right kidney function. Patient reports she has been having  8 days now of right flank pain. She was originally started on Cipro by her nurse at work and then switched to Bactrim after her emergency department visit. She reports she has follow-up with urology tomorrow. She reports she is continue to have right flank pain and this prompted her to come to the emergency department today. She reports she's been taking Tylenol and ibuprofen intermittently with some relief of her pain. She currently complains of 6 out of 10 right flank pain. She denies any dysuria, hematuria, urinary frequency or urinary urgency currently. She has been tolerating by mouth. She reports some low-grade fevers at home. No antipyretics in more than 6 hours. No fever on arrival to the emergency department. On exam the patient is afebrile nontoxic appearing. Abdomen is soft nontender to palpation. She does  have right flank tenderness to palpation. Mucous membranes are moist. Urinalysis has improved greatly from her most recent. No signs of infection today. BMP and CBC were obtained. BMP shows her creatinine has improved to 0.96. CT shows no leukocytosis. Normal hemoglobin. His improvement from her blood work 3 days ago. It appears the Bactrim as been helping. Patient continues to have pain. No sign of stone on CT renal stone study 3 days ago. Her history is not consistent with a stone. I'm concerned that her pain is possibly related to her poor right kidney function. Patient has follow-up with urology tomorrow. I feel she needs to follow-up with specialist and does not require any additional emergent imaging today. I will treat her pain with Vicodin at home. I encouraged her to not take any NSAIDs. I encouraged her to keep her follow-up appointment with urology tomorrow. Patient agrees with plan for discharge. I discussed return precautions. I advised the patient to follow-up with their primary care provider this week. I advised the patient to return to the emergency department with new or worsening symptoms or new concerns. The patient verbalized understanding and agreement with plan.    This patient was discussed with Dr. Anitra Lauth who agrees with assessment and plan.     Final Clinical Impressions(s) / ED Diagnoses   Final diagnoses:  Right flank pain    New Prescriptions Discharge Medication List as of 11/15/2015  3:55 PM    START taking these medications   Details  HYDROcodone-acetaminophen (NORCO/VICODIN) 5-325 MG tablet Take 1-2 tablets by mouth every 6 (six) hours as needed for moderate pain., Starting Tue 11/15/2015, Print    ondansetron (ZOFRAN ODT) 4 MG disintegrating tablet Take 1 tablet (4 mg total) by mouth every 8 (eight) hours as needed for nausea or vomiting., Starting Tue 11/15/2015, Print    polyethylene glycol powder (GLYCOLAX/MIRALAX) powder Take 17 g by mouth 2 (two) times daily.  Until daily soft stools  OTC, Starting Tue 11/15/2015, Print         Everlene Farrier, PA-C 11/15/15 1727    Gwyneth Sprout, MD 11/15/15 2146

## 2015-11-16 ENCOUNTER — Other Ambulatory Visit: Payer: Self-pay | Admitting: Urology

## 2015-11-17 ENCOUNTER — Encounter (HOSPITAL_BASED_OUTPATIENT_CLINIC_OR_DEPARTMENT_OTHER): Payer: Self-pay | Admitting: Certified Registered"

## 2015-11-17 ENCOUNTER — Ambulatory Visit (HOSPITAL_BASED_OUTPATIENT_CLINIC_OR_DEPARTMENT_OTHER): Payer: BLUE CROSS/BLUE SHIELD | Admitting: Anesthesiology

## 2015-11-17 ENCOUNTER — Ambulatory Visit (HOSPITAL_BASED_OUTPATIENT_CLINIC_OR_DEPARTMENT_OTHER)
Admission: RE | Admit: 2015-11-17 | Discharge: 2015-11-17 | Disposition: A | Discharge status: Home / Self Care | Payer: BLUE CROSS/BLUE SHIELD | Source: Ambulatory Visit | Attending: Urology | Admitting: Urology

## 2015-11-17 ENCOUNTER — Encounter (HOSPITAL_BASED_OUTPATIENT_CLINIC_OR_DEPARTMENT_OTHER)
Admission: RE | Disposition: A | Discharge status: Home / Self Care | Payer: Self-pay | Source: Ambulatory Visit | Attending: Urology

## 2015-11-17 DIAGNOSIS — N159 Renal tubulo-interstitial disease, unspecified: Secondary | ICD-10-CM | POA: Diagnosis present

## 2015-11-17 DIAGNOSIS — N136 Pyonephrosis: Secondary | ICD-10-CM | POA: Diagnosis not present

## 2015-11-17 DIAGNOSIS — N261 Atrophy of kidney (terminal): Secondary | ICD-10-CM | POA: Insufficient documentation

## 2015-11-17 HISTORY — PX: CYSTOSCOPY W/ URETERAL STENT PLACEMENT: SHX1429

## 2015-11-17 HISTORY — DX: Chronic kidney disease, unspecified: N18.9

## 2015-11-17 LAB — PREGNANCY, URINE: PREG TEST UR: NEGATIVE

## 2015-11-17 LAB — POCT HEMOGLOBIN-HEMACUE: HEMOGLOBIN: 11.8 g/dL — AB (ref 12.0–15.0)

## 2015-11-17 SURGERY — CYSTOSCOPY, WITH RETROGRADE PYELOGRAM AND URETERAL STENT INSERTION
Anesthesia: General | Site: Ureter | Laterality: Right

## 2015-11-17 MED ORDER — SULFAMETHOXAZOLE-TRIMETHOPRIM 800-160 MG PO TABS: 1.0000 | ORAL_TABLET | Freq: Two times a day (BID) | ORAL | 0 refills | Status: DC

## 2015-11-17 MED ORDER — MIDAZOLAM HCL 2 MG/2ML IJ SOLN
INTRAMUSCULAR | Status: AC
  Filled 2015-11-17: qty 2

## 2015-11-17 MED ORDER — CEFAZOLIN SODIUM-DEXTROSE 2-4 GM/100ML-% IV SOLN
INTRAVENOUS | Status: AC
Start: 2015-11-17 — End: 2015-11-17
  Filled 2015-11-17: qty 100

## 2015-11-17 MED ORDER — CEFAZOLIN IN D5W 1 GM/50ML IV SOLN
1.0000 g | INTRAVENOUS | Status: DC
  Filled 2015-11-17: qty 50

## 2015-11-17 MED ORDER — IOHEXOL 300 MG/ML  SOLN
INTRAMUSCULAR | Status: DC | PRN
  Administered 2015-11-17: 7 mL

## 2015-11-17 MED ORDER — FENTANYL CITRATE (PF) 100 MCG/2ML IJ SOLN
INTRAMUSCULAR | Status: AC
  Filled 2015-11-17: qty 2

## 2015-11-17 MED ORDER — CEFAZOLIN SODIUM-DEXTROSE 2-4 GM/100ML-% IV SOLN
2.0000 g | INTRAVENOUS | Status: AC
  Administered 2015-11-17: 2 g via INTRAVENOUS
  Filled 2015-11-17: qty 100

## 2015-11-17 MED ORDER — PROPOFOL 10 MG/ML IV BOLUS
INTRAVENOUS | Status: AC
  Filled 2015-11-17: qty 20

## 2015-11-17 MED ORDER — KETOROLAC TROMETHAMINE 30 MG/ML IJ SOLN
INTRAMUSCULAR | Status: DC | PRN
  Administered 2015-11-17: 30 mg via INTRAVENOUS

## 2015-11-17 MED ORDER — SODIUM CHLORIDE 0.9 % IR SOLN
Status: DC | PRN
  Administered 2015-11-17: 3000 mL

## 2015-11-17 MED ORDER — FENTANYL CITRATE (PF) 100 MCG/2ML IJ SOLN
INTRAMUSCULAR | Status: DC | PRN
  Administered 2015-11-17: 25 ug via INTRAVENOUS
  Administered 2015-11-17: 50 ug via INTRAVENOUS
  Administered 2015-11-17: 25 ug via INTRAVENOUS

## 2015-11-17 MED ORDER — PROPOFOL 10 MG/ML IV BOLUS
INTRAVENOUS | Status: DC | PRN
  Administered 2015-11-17: 200 mg via INTRAVENOUS

## 2015-11-17 MED ORDER — LACTATED RINGERS IV SOLN
INTRAVENOUS | Status: DC
  Administered 2015-11-17: 12:00:00 via INTRAVENOUS
  Filled 2015-11-17: qty 1000

## 2015-11-17 MED ORDER — FENTANYL CITRATE (PF) 100 MCG/2ML IJ SOLN
25.0000 ug | INTRAMUSCULAR | Status: DC | PRN
  Filled 2015-11-17: qty 1

## 2015-11-17 MED ORDER — OXYBUTYNIN CHLORIDE 5 MG PO TABS: 5.0000 mg | ORAL_TABLET | Freq: Three times a day (TID) | ORAL | 1 refills | Status: DC | PRN

## 2015-11-17 MED ORDER — DEXAMETHASONE SODIUM PHOSPHATE 4 MG/ML IJ SOLN
INTRAMUSCULAR | Status: DC | PRN
  Administered 2015-11-17: 10 mg via INTRAVENOUS

## 2015-11-17 MED ORDER — STERILE WATER FOR IRRIGATION IR SOLN
Status: DC | PRN
  Administered 2015-11-17: 500 mL

## 2015-11-17 MED ORDER — LIDOCAINE HCL (CARDIAC) 20 MG/ML IV SOLN
INTRAVENOUS | Status: AC
  Filled 2015-11-17: qty 5

## 2015-11-17 MED ORDER — LIDOCAINE 2% (20 MG/ML) 5 ML SYRINGE
INTRAMUSCULAR | Status: DC | PRN
  Administered 2015-11-17: 60 mg via INTRAVENOUS

## 2015-11-17 MED ORDER — PROMETHAZINE HCL 25 MG/ML IJ SOLN
6.2500 mg | INTRAMUSCULAR | Status: DC | PRN
  Filled 2015-11-17: qty 1

## 2015-11-17 MED ORDER — MIDAZOLAM HCL 5 MG/5ML IJ SOLN
INTRAMUSCULAR | Status: DC | PRN
  Administered 2015-11-17: 2 mg via INTRAVENOUS

## 2015-11-17 MED ORDER — ONDANSETRON HCL 4 MG/2ML IJ SOLN
INTRAMUSCULAR | Status: DC | PRN
  Administered 2015-11-17: 4 mg via INTRAVENOUS

## 2015-11-17 SURGICAL SUPPLY — 20 items
ADAPTER CATH URET PLST 4-6FR (CATHETERS) IMPLANT
ADPR CATH URET STRL DISP 4-6FR (CATHETERS)
BAG DRAIN URO-CYSTO SKYTR STRL (DRAIN) ×3 IMPLANT
BAG DRN UROCATH (DRAIN) ×1
CANISTER SUCT LVC 12 LTR MEDI- (MISCELLANEOUS) IMPLANT
CATH INTERMIT  6FR 70CM (CATHETERS) IMPLANT
CLOTH BEACON ORANGE TIMEOUT ST (SAFETY) ×3 IMPLANT
GLOVE BIO SURGEON STRL SZ8 (GLOVE) ×3 IMPLANT
GOWN STRL REUS W/ TWL XL LVL3 (GOWN DISPOSABLE) ×1 IMPLANT
GOWN STRL REUS W/TWL XL LVL3 (GOWN DISPOSABLE) ×3
GUIDEWIRE 0.038 PTFE COATED (WIRE) IMPLANT
GUIDEWIRE ANG ZIPWIRE 038X150 (WIRE) IMPLANT
GUIDEWIRE STR DUAL SENSOR (WIRE) IMPLANT
KIT ROOM TURNOVER WOR (KITS) ×3 IMPLANT
MANIFOLD NEPTUNE II (INSTRUMENTS) IMPLANT
NS IRRIG 500ML POUR BTL (IV SOLUTION) IMPLANT
PACK CYSTO (CUSTOM PROCEDURE TRAY) ×3 IMPLANT
STENT URET 6FRX24 CONTOUR (STENTS) ×2 IMPLANT
TUBE CONNECTING 12'X1/4 (SUCTIONS)
TUBE CONNECTING 12X1/4 (SUCTIONS) IMPLANT

## 2015-11-17 NOTE — H&P (Signed)
  H&P  Chief Complaint: Kidney infection  History of Present Illness: Lisa Hayes is a 45 y.o. 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 It caused significant problems during her pregnancy 8 years ago with pain, but has been quiet until an associated infection was evident last week. She presented with flank pain and dysuria to the ER last week and was found to have significant hydro and an e coli UTI. She has not been febrile, but has had continued pain. She presents now for cysto, J2 stent placement for drainage of a probably infected/dilated right renal system. She will probably need an eventual nephrectomy.  No past medical history on file.  No past surgical history on file.  Home Medications:  No prescriptions prior to admission.    Allergies: No Known Allergies  No family history on file.  Social History:  reports that she has never smoked. She does not have any smokeless tobacco history on file. She reports that she does not use drugs. Her alcohol history is not on file.  ROS: A complete review of systems was performed.  All systems are negative except for pertinent findings as noted.  Physical Exam:  Vital signs in last 24 hours:   General:  Alert and oriented, No acute distress HEENT: Normocephalic, atraumatic Neck: No JVD or lymphadenopathy Cardiovascular: Regular rate and rhythm Lungs: Clear bilaterally Abdomen: Soft, tender in RUQ, nondistended, no abdominal masses Back: Right CVA tenderness Extremities: No edema Neurologic: Grossly intact  Laboratory Data:  No results found for this or any previous visit (from the past 24 hour(s)). Recent Results (from the past 240 hour(s))  Urine culture     Status: None   Collection Time: 11/12/15  1:30 PM  Result Value Ref Range Status   Specimen Description URINE, CLEAN CATCH  Final   Special Requests NONE  Final   Culture NO  GROWTH Performed at [facility]   Final   Report Status 11/14/2015 FINAL  Final   Creatinine:  Recent Labs  11/12/15 1430 11/15/15 1445  CREATININE 1.16* 0.96    Radiologic Imaging: No results found.  Impression/Assessment:  Chronic rt UPJ obstruction with pyonephrosis  Plan:  Cysto, rt J2 stent placement  Chelsea Aus 11/17/2015, 6:37 AM  Bertram Millard. Precious Segall MD

## 2015-11-17 NOTE — Transfer of Care (Signed)
Immediate Anesthesia Transfer of Care Note  Patient: Lisa Hayes  Procedure(s) Performed: Procedure(s) (LRB): CYSTOSCOPY WITH RETROGRADE PYELOGRAM/URETERAL STENT PLACEMENT, RIGHT (Right)  Patient Location: PACU  Anesthesia Type: General  Level of Consciousness: awake, oriented, sedated and patient cooperative  Airway & Oxygen Therapy: Patient Spontanous Breathing and Patient connected to face mask oxygen  Post-op Assessment: Report given to PACU RN and Post -op Vital signs reviewed and stable  Post vital signs: Reviewed and stable  Complications: No apparent anesthesia complications

## 2015-11-17 NOTE — Op Note (Signed)
Preoperative diagnosis: Pyonephrosis/chronic UPJ obstruction/atrophic kidney  Postoperative diagnosis: Same  Principal procedure: Cystoscopy, right retrograde ureteropyelogram, placement of double-J stent and right kidney-24 cm x 6 French contour, without string, fluoroscopic interpretation  Surgeon: Jennise Both  Anesthesia: Gen. with LMA  Complications: None  Specimen: Urine from right renal pelvis for culture  Indications: 45 year old female with a long-standing history, previously known, of the UPJ obstruction of the right kidney. She has, because of the chronic obstruction, a significantly atrophic kidney with compensatory hypertrophy of the left renal unit. She has had minimal symptoms from her obstructed kidney, save for her pregnancy 9 years ago. She recently presented to the emergency room with significant flank pain, low-grade fever, dysuria. She was found to have a UTI, and CT scan was performed showing significant hydronephrosis, with the kidney significantly distended on the right him a with a minimal rim of renal parenchyma. There was perinephric inflammation. The patient was started on Cipro, which was eventually changed to Bactrim upon receiving the culture results showing a pansensitive Escherichia coli. Patient still has significant pain, and presented to my practice yesterday with the above history. Because of her UTI, as well as probable pyonephrosis, it was recommended patient undergo prompt decompression of right renal unit-either through percutaneous nephrostomy tube placed or double-J stent placement. The patient will be traveling soon, and is asked that we not have a percutaneous tube placed. She presents for double-J stent placement, and his where brisk and complications of the procedure including postoperative fever, stent pain, anesthetic complications, among others. She desires to proceed.  Description of procedure: The patient was properly identified and marked in the  holding area. She has been receiving by mouth Bactrim, but was given Ancef intravenously. She was taken the operating room where general anesthetic was administered with the LMA. She was placed in the dorsolithotomy position. Genitalia and perineum were prepped and draped. Proper timeout was performed.  A 23 French panendoscope was advanced into her bladder. The bladder was inspected circumferentially. Ureteral orifices were normal in configuration and location. Urothelium appeared normal. The right ureteral orifice was cannulated with a 6 [place] open-ended catheter. Using Cystografin, gentle retrograde ureteropyelogram was performed. This revealed a normal mid and distal ureter. At the UPJ, there was a tortuosity approximate 2 cm in length consistent with an adynamic ureteral segment. There was minimal contrast past this and into the renal pelvis. Following this, I then advanced a sensor-tip guidewire up through the ureter, past the UPJ obstruction in the upper pole calyceal system. The open-ended catheter was advanced over this. The guidewire was removed. I then decompressed the pelvis with draining the infected urine with the syringe. Approximate 200 mL of purulent urine was obtained. This was sent for culture. Following range to the kidney, I then replaced the guidewire through the open-ended catheter. The open-ended catheter was removed, and cystoscopically and using fluoroscopic guidance, a 24 cm x 6 French contour double-J stent was deployed in the kidney following removal of the guidewire. Excellent proximal and distal curls were seen fluoroscopically and cystoscopically at this point. The bladder was then drained. The cystoscope was then removed.  The patient was then awakened and taken to the PACU in stable condition.

## 2015-11-17 NOTE — Discharge Instructions (Signed)
CYSTOSCOPY HOME CARE INSTRUCTIONS  Activity: Rest for the remainder of the day.  Do not drive or operate equipment today.  You may resume normal activities in one to two days as instructed by your physician.   Meals: Drink plenty of liquids and eat light foods such as gelatin or soup this evening.  You may return to a normal meal plan tomorrow.  Return to Work: You may return to work in one to two days or as instructed by your physician.  Special Instructions / Symptoms: Call your physician if any of these symptoms occur:  -persistent or heavy bleeding  -bleeding which continues after first few urination  -large blood clots that are difficult to pass  -urine stream diminishes or stops completely        -fever equal to or higher than 101 degrees Farenheit.  -cloudy urine with a strong, foul odor  -severe pain  Females should always wipe from front to back after elimination.  You may feel some burning pain when you urinate.  This should disappear with time.  Applying moist heat to the lower abdomen or a hot tub bath may help relieve the pain. \  Follow-Up / Date of Return Visit to Your Physician:  As instructed Call for an appointment to arrange follow-up.  Patient Signature:  ________________________________________________________  Nurse's Signature:  ________________________________________________________  Post Anesthesia Home Care Instructions  Activity: Get plenty of rest for the remainder of the day. A responsible adult should stay with you for 24 hours following the procedure.  For the next 24 hours, DO NOT: -Drive a car -Advertising copywriter -Drink alcoholic beverages -Take any medication unless instructed by your physician -Make any legal decisions or sign important papers.  Meals: Start with liquid foods such as gelatin or soup. Progress to regular foods as tolerated. Avoid greasy, spicy, heavy foods. If nausea and/or vomiting occur, drink only clear liquids until the  nausea and/or vomiting subsides. Call your physician if vomiting continues.  Special Instructions/Symptoms: Your throat may feel dry or sore from the anesthesia or the breathing tube placed in your throat during surgery. If this causes discomfort, gargle with warm salt water. The discomfort should disappear within 24 hours.  If you had a scopolamine patch placed behind your ear for the management of post- operative nausea and/or vomiting:  1. The medication in the patch is effective for 72 hours, after which it should be removed.  Wrap patch in a tissue and discard in the trash. Wash hands thoroughly with soap and water. 2. You may remove the patch earlier than 72 hours if you experience unpleasant side effects which may include dry mouth, dizziness or visual disturbances. 3. Avoid touching the patch. Wash your hands with soap and water after contact with the patch.   You may see some blood in the urine and may have some burning with urination for 48-72 hours. You also may notice that you have to urinate more frequently or urgently after your procedure which is normal.  You should call should you develop an inability urinate, fever > 101, persistent nausea and vomiting that prevents you from eating or drinking to stay hydrated.  If you have a stent, you will likely urinate more frequently and urgently until the stent is removed and you may experience some discomfort/pain in the lower abdomen and flank especially when urinating. You may take pain medication prescribed to you if needed for pain. You may also intermittently have blood in the urine until the stent is  removed. If you have a catheter, you will be taught how to take care of the catheter by the nursing staff prior to discharge from the hospital.  You may periodically feel a strong urge to void with the catheter in place.  This is a bladder spasm and most often can occur when having a bowel movement or moving around. It is typically  self-limited and usually will stop after a few minutes.  You may use some Vaseline or Neosporin around the tip of the catheter to reduce friction at the tip of the penis. You may also see some blood in the urine.  A very small amount of blood can make the urine look quite red.  As long as the catheter is draining well, there usually is not a problem.  However, if the catheter is not draining well and is bloody, you should call the office [phone]) to notify us.

## 2015-11-17 NOTE — Anesthesia Postprocedure Evaluation (Signed)
Anesthesia Post Note  Patient: TIRA SCHMIESING  Procedure(s) Performed: Procedure(s) (LRB): CYSTOSCOPY WITH RETROGRADE PYELOGRAM/URETERAL STENT PLACEMENT, RIGHT (Right)  Patient location during evaluation: PACU Anesthesia Type: General Level of consciousness: awake and alert Pain management: pain level controlled Vital Signs Assessment: post-procedure vital signs reviewed and stable Respiratory status: spontaneous breathing, nonlabored ventilation, respiratory function stable and patient connected to nasal cannula oxygen Cardiovascular status: blood pressure returned to baseline and stable Postop Assessment: no signs of nausea or vomiting Anesthetic complications: no    Last Vitals:  Vitals:   11/17/15 1315 11/17/15 1424  BP: (!) 114/94 125/75  Pulse: 79 81  Resp: 19 16  Temp:  36.9 C    Last Pain:  Vitals:   11/17/15 1424  TempSrc: Oral  PainSc: 0-No pain                 Shirlee Whitmire J

## 2015-11-17 NOTE — Anesthesia Preprocedure Evaluation (Signed)
Anesthesia Evaluation  Patient identified by MRN, date of birth, ID band Patient awake    Reviewed: Allergy & Precautions, NPO status , Patient's Chart, lab work & pertinent test results  Airway Mallampati: II  TM Distance: >3 FB Neck ROM: Full    Dental no notable dental hx.    Pulmonary neg pulmonary ROS,    Pulmonary exam normal breath sounds clear to auscultation       Cardiovascular negative cardio ROS Normal cardiovascular exam Rhythm:Regular Rate:Normal     Neuro/Psych negative neurological ROS  negative psych ROS   GI/Hepatic negative GI ROS, Neg liver ROS,   Endo/Other  negative endocrine ROS  Renal/GU negative Renal ROS  negative genitourinary   Musculoskeletal negative musculoskeletal ROS (+)   Abdominal   Peds negative pediatric ROS (+)  Hematology negative hematology ROS (+)   Anesthesia Other Findings   Reproductive/Obstetrics negative OB ROS                             Anesthesia Physical Anesthesia Plan  ASA: II  Anesthesia Plan: General   Post-op Pain Management:    Induction: Intravenous  Airway Management Planned: LMA  Additional Equipment:   Intra-op Plan:   Post-operative Plan: Extubation in OR  Informed Consent: I have reviewed the patients History and Physical, chart, labs and discussed the procedure including the risks, benefits and alternatives for the proposed anesthesia with the patient or authorized representative who has indicated his/her understanding and acceptance.   Dental advisory given  Plan Discussed with: CRNA  Anesthesia Plan Comments:         Anesthesia Quick Evaluation  

## 2015-11-17 NOTE — Anesthesia Procedure Notes (Signed)
Procedure Name: LMA Insertion Date/Time: 11/17/2015 12:42 PM Performed by: Renella Cunas D Pre-anesthesia Checklist: Patient identified, Emergency Drugs available, Suction available and Patient being monitored Patient Re-evaluated:Patient Re-evaluated prior to inductionOxygen Delivery Method: Circle system utilized Preoxygenation: Pre-oxygenation with 100% oxygen Intubation Type: IV induction Ventilation: Mask ventilation without difficulty LMA: LMA inserted LMA Size: 4.0 Number of attempts: 1 Airway Equipment and Method: Bite block Placement Confirmation: positive ETCO2 Tube secured with: Tape Dental Injury: Teeth and Oropharynx as per pre-operative assessment

## 2015-11-18 ENCOUNTER — Encounter (HOSPITAL_BASED_OUTPATIENT_CLINIC_OR_DEPARTMENT_OTHER): Payer: Self-pay | Admitting: Urology

## 2015-11-18 LAB — URINE CULTURE: CULTURE: NO GROWTH

## 2015-12-13 ENCOUNTER — Other Ambulatory Visit: Payer: Self-pay | Admitting: Urology

## 2015-12-16 ENCOUNTER — Encounter (HOSPITAL_COMMUNITY): Payer: Self-pay

## 2015-12-16 ENCOUNTER — Encounter (HOSPITAL_COMMUNITY)
Admission: RE | Admit: 2015-12-16 | Discharge: 2015-12-16 | Disposition: A | Discharge status: Home / Self Care | Payer: BLUE CROSS/BLUE SHIELD | Source: Ambulatory Visit | Attending: Urology | Admitting: Urology

## 2015-12-16 DIAGNOSIS — Z01812 Encounter for preprocedural laboratory examination: Secondary | ICD-10-CM | POA: Insufficient documentation

## 2015-12-16 HISTORY — DX: Other specified behavioral and emotional disorders with onset usually occurring in childhood and adolescence: F98.8

## 2015-12-16 LAB — BASIC METABOLIC PANEL
Anion gap: 4 — ABNORMAL LOW (ref 5–15)
BUN: 13 mg/dL (ref 6–20)
CHLORIDE: 109 mmol/L (ref 101–111)
CO2: 28 mmol/L (ref 22–32)
Calcium: 8.7 mg/dL — ABNORMAL LOW (ref 8.9–10.3)
Creatinine, Ser: 1.32 mg/dL — ABNORMAL HIGH (ref 0.44–1.00)
GFR calc Af Amer: 55 mL/min — ABNORMAL LOW (ref >60)
GFR, EST NON AFRICAN AMERICAN: 48 mL/min — AB (ref >60)
GLUCOSE: 98 mg/dL (ref 65–99)
POTASSIUM: 4.1 mmol/L (ref 3.5–5.1)
Sodium: 141 mmol/L (ref 135–145)

## 2015-12-16 LAB — ABO/RH: ABO/RH(D): O POS

## 2015-12-16 LAB — CBC
HEMATOCRIT: 38.9 % (ref 36.0–46.0)
Hemoglobin: 13.1 g/dL (ref 12.0–15.0)
MCH: 29.7 pg (ref 26.0–34.0)
MCHC: 33.7 g/dL (ref 30.0–36.0)
MCV: 88.2 fL (ref 78.0–100.0)
Platelets: 174 10*3/uL (ref 150–400)
RBC: 4.41 MIL/uL (ref 3.87–5.11)
RDW: 13.4 % (ref 11.5–15.5)
WBC: 6.2 10*3/uL (ref 4.0–10.5)

## 2015-12-16 NOTE — Patient Instructions (Addendum)
Lisa Hayes  12/16/2015   Your procedure is scheduled on: Friday 12/23/2015  Report to [facility] Main  Entrance take [place]  elevators to 3rd floor to  Short Stay Center at   0800  AM.  Call this number if you have problems the morning of surgery [phone]   Remember: ONLY 1 PERSON MAY GO WITH YOU TO SHORT STAY TO GET  READY MORNING OF YOUR SURGERY.                FOLLOW DR. HERRICK'S BOWEL PREP INSTRUCTIONS DAY BEFORE SURGERY ALONG WITH A CLEAR LIQUID DIET ALL DAY!                   CLEAR LIQUID DIET   Foods Allowed                                                                     Foods Excluded  Coffee and tea, regular and decaf                             liquids that you cannot  Plain Jell-O in any flavor                                             see through such as: Fruit ices (not with fruit pulp)                                     milk, soups, orange juice  Iced Popsicles                                    All solid food Carbonated beverages, regular and diet                                    Cranberry, grape and apple juices Sports drinks like Gatorade Lightly seasoned clear broth or consume(fat free) Sugar, honey syrup  Sample Menu Breakfast                                Lunch                                     Supper Cranberry juice                    Beef broth                            Chicken broth Jell-O  Grape juice                           Apple juice Coffee or tea                        Jell-O                                      Popsicle                                                Coffee or tea                        Coffee or tea  _____________________________________________________________________                                                                                                         Do not eat food or drink liquids :After Midnight.     Take these medicines the  morning of surgery with A SIP OF WATER: Zantac if needed                                 You may not have any metal on your body including hair pins and              piercings  Do not wear jewelry, make-up, lotions, powders or perfumes, deodorant             Do not wear nail polish.  Do not shave  48 hours prior to surgery.              Men may shave face and neck.   Do not bring valuables to the hospital. [facility] IS NOT             RESPONSIBLE   FOR VALUABLES.  Contacts, dentures or bridgework may not be worn into surgery.  Leave suitcase in the car. After surgery it may be brought to your room.                 Please read over the following fact sheets you were given: _____________________________________________________________________             [facility] - Preparing for Surgery Before surgery, you can play an important role.  Because skin is not sterile, your skin needs to be as free of germs as possible.  You can reduce the number of germs on your skin by washing with CHG (chlorahexidine gluconate) soap before surgery.  CHG is an antiseptic cleaner which kills germs and bonds with the skin to continue killing germs even after washing. Please DO NOT use if you have an allergy to CHG  or antibacterial soaps.  If your skin becomes reddened/irritated stop using the CHG and inform your nurse when you arrive at Short Stay. Do not shave (including legs and underarms) for at least 48 hours prior to the first CHG shower.  You may shave your face/neck. Please follow these instructions carefully:  1.  Shower with CHG Soap the night before surgery and the  morning of Surgery.  2.  If you choose to wash your hair, wash your hair first as usual with your  normal  shampoo.  3.  After you shampoo, rinse your hair and body thoroughly to remove the  shampoo.                           4.  Use CHG as you would any other liquid soap.  You can apply chg directly  to the skin and wash                        Gently with a scrungie or clean washcloth.  5.  Apply the CHG Soap to your body ONLY FROM THE NECK DOWN.   Do not use on face/ open                           Wound or open sores. Avoid contact with eyes, ears mouth and genitals (private parts).                       Wash face,  Genitals (private parts) with your normal soap.             6.  Wash thoroughly, paying special attention to the area where your surgery  will be performed.  7.  Thoroughly rinse your body with warm water from the neck down.  8.  DO NOT shower/wash with your normal soap after using and rinsing off  the CHG Soap.                9.  Pat yourself dry with a clean towel.            10.  Wear clean pajamas.            11.  Place clean sheets on your bed the night of your first shower and do not  sleep with pets. Day of Surgery : Do not apply any lotions/deodorants the morning of surgery.  Please wear clean clothes to the hospital/surgery center.  FAILURE TO FOLLOW THESE INSTRUCTIONS MAY RESULT IN THE CANCELLATION OF YOUR SURGERY PATIENT SIGNATURE_________________________________  NURSE SIGNATURE__________________________________  ________________________________________________________________________  WHAT IS A BLOOD TRANSFUSION? Blood Transfusion Information  A transfusion is the replacement of blood or some of its parts. Blood is made up of multiple cells which provide different functions.  Red blood cells carry oxygen and are used for blood loss replacement.  White blood cells fight against infection.  Platelets control bleeding.  Plasma helps clot blood.  Other blood products are available for specialized needs, such as hemophilia or other clotting disorders. BEFORE THE TRANSFUSION  Who gives blood for transfusions?   Healthy volunteers who are fully evaluated to make sure their blood is safe. This is blood bank blood. Transfusion therapy is the safest it has ever been in the practice of medicine.  Before blood is taken from a donor, a complete history is taken to make sure that person has no  history of diseases nor engages in risky social behavior (examples are intravenous drug use or sexual activity with multiple partners). The donor's travel history is screened to minimize risk of transmitting infections, such as malaria. The donated blood is tested for signs of infectious diseases, such as HIV and hepatitis. The blood is then tested to be sure it is compatible with you in order to minimize the chance of a transfusion reaction. If you or a relative donates blood, this is often done in anticipation of surgery and is not appropriate for emergency situations. It takes many days to process the donated blood. RISKS AND COMPLICATIONS Although transfusion therapy is very safe and saves many lives, the main dangers of transfusion include:   Getting an infectious disease.  Developing a transfusion reaction. This is an allergic reaction to something in the blood you were given. Every precaution is taken to prevent this. The decision to have a blood transfusion has been considered carefully by your caregiver before blood is given. Blood is not given unless the benefits outweigh the risks. AFTER THE TRANSFUSION  Right after receiving a blood transfusion, you will usually feel much better and more energetic. This is especially true if your red blood cells have gotten low (anemic). The transfusion raises the level of the red blood cells which carry oxygen, and this usually causes an energy increase.  The nurse administering the transfusion will monitor you carefully for complications. HOME CARE INSTRUCTIONS  No special instructions are needed after a transfusion. You may find your energy is better. Speak with your caregiver about any limitations on activity for underlying diseases you may have. SEEK MEDICAL CARE IF:   Your condition is not improving after your transfusion.  You develop redness or  irritation at the intravenous (IV) site. SEEK IMMEDIATE MEDICAL CARE IF:  Any of the following symptoms occur over the next 12 hours:  Shaking chills.  You have a temperature by mouth above 102 F (38.9 C), not controlled by medicine.  Chest, back, or muscle pain.  People around you feel you are not acting correctly or are confused.  Shortness of breath or difficulty breathing.  Dizziness and fainting.  You get a rash or develop hives.  You have a decrease in urine output.  Your urine turns a dark color or changes to pink, red, or brown. Any of the following symptoms occur over the next 10 days:  You have a temperature by mouth above 102 F (38.9 C), not controlled by medicine.  Shortness of breath.  Weakness after normal activity.  The white part of the eye turns yellow (jaundice).  You have a decrease in the amount of urine or are urinating less often.  Your urine turns a dark color or changes to pink, red, or brown. Document Released: 04/06/2000 Document Revised: 07/02/2011 Document Reviewed: 11/24/2007 [facility] Patient Information 2014 [place], [place].  _______________________________________________________________________

## 2015-12-17 LAB — URINE CULTURE: CULTURE: NO GROWTH

## 2015-12-23 ENCOUNTER — Inpatient Hospital Stay (HOSPITAL_COMMUNITY): Payer: BLUE CROSS/BLUE SHIELD | Admitting: Anesthesiology

## 2015-12-23 ENCOUNTER — Encounter (HOSPITAL_COMMUNITY): Payer: Self-pay

## 2015-12-23 ENCOUNTER — Inpatient Hospital Stay (HOSPITAL_COMMUNITY)
Admission: RE | Admit: 2015-12-23 | Discharge: 2015-12-25 | DRG: 661 | Disposition: A | Discharge status: Home / Self Care | Payer: BLUE CROSS/BLUE SHIELD | Source: Ambulatory Visit | Attending: Urology | Admitting: Urology

## 2015-12-23 ENCOUNTER — Encounter (HOSPITAL_COMMUNITY)
Admission: RE | Disposition: A | Discharge status: Home / Self Care | Payer: Self-pay | Source: Ambulatory Visit | Attending: Urology

## 2015-12-23 DIAGNOSIS — N135 Crossing vessel and stricture of ureter without hydronephrosis: Secondary | ICD-10-CM | POA: Diagnosis present

## 2015-12-23 DIAGNOSIS — Z8249 Family history of ischemic heart disease and other diseases of the circulatory system: Secondary | ICD-10-CM

## 2015-12-23 DIAGNOSIS — F988 Other specified behavioral and emotional disorders with onset usually occurring in childhood and adolescence: Secondary | ICD-10-CM | POA: Diagnosis present

## 2015-12-23 DIAGNOSIS — Q6239 Other obstructive defects of renal pelvis and ureter: Principal | ICD-10-CM

## 2015-12-23 HISTORY — PX: LAPAROSCOPIC NEPHRECTOMY: SHX1930

## 2015-12-23 LAB — TYPE AND SCREEN
ABO/RH(D): O POS
Antibody Screen: NEGATIVE

## 2015-12-23 LAB — HCG, SERUM, QUALITATIVE: Preg, Serum: NEGATIVE

## 2015-12-23 SURGERY — NEPHRECTOMY, RADICAL, LAPAROSCOPIC, ADULT
Anesthesia: General | Laterality: Right

## 2015-12-23 MED ORDER — CEFAZOLIN SODIUM-DEXTROSE 2-4 GM/100ML-% IV SOLN
INTRAVENOUS | Status: AC
  Filled 2015-12-23: qty 100

## 2015-12-23 MED ORDER — TRAMADOL HCL 50 MG PO TABS: 50.0000 mg | ORAL_TABLET | Freq: Four times a day (QID) | ORAL | 0 refills | Status: DC | PRN

## 2015-12-23 MED ORDER — LIDOCAINE 2% (20 MG/ML) 5 ML SYRINGE
INTRAMUSCULAR | Status: AC
  Filled 2015-12-23: qty 10

## 2015-12-23 MED ORDER — DOCUSATE SODIUM 100 MG PO CAPS
100.0000 mg | ORAL_CAPSULE | Freq: Two times a day (BID) | ORAL | 1 refills | Status: AC
Start: 2015-12-23 — End: 2016-01-22

## 2015-12-23 MED ORDER — DOCUSATE SODIUM 100 MG PO CAPS: 100.0000 mg | ORAL_CAPSULE | Freq: Two times a day (BID) | ORAL | 1 refills | Status: DC

## 2015-12-23 MED ORDER — MEPERIDINE HCL 50 MG/ML IJ SOLN: 6.2500 mg | INTRAMUSCULAR | Status: DC | PRN

## 2015-12-23 MED ORDER — SODIUM CHLORIDE 0.9 % IJ SOLN
INTRAMUSCULAR | Status: AC
  Filled 2015-12-23: qty 20

## 2015-12-23 MED ORDER — FAMOTIDINE 20 MG PO TABS
20.0000 mg | ORAL_TABLET | Freq: Every day | ORAL | Status: DC
  Administered 2015-12-24 – 2015-12-25 (×2): 20 mg via ORAL
  Filled 2015-12-23 (×2): qty 1

## 2015-12-23 MED ORDER — SENNA 8.6 MG PO TABS
1.0000 | ORAL_TABLET | Freq: Two times a day (BID) | ORAL | Status: DC
  Administered 2015-12-23 – 2015-12-25 (×4): 8.6 mg via ORAL
  Filled 2015-12-23 (×4): qty 1

## 2015-12-23 MED ORDER — OXYBUTYNIN CHLORIDE 5 MG PO TABS: 5.0000 mg | ORAL_TABLET | Freq: Three times a day (TID) | ORAL | Status: DC | PRN

## 2015-12-23 MED ORDER — ROCURONIUM BROMIDE 10 MG/ML (PF) SYRINGE
PREFILLED_SYRINGE | INTRAVENOUS | Status: DC | PRN
  Administered 2015-12-23: 40 mg via INTRAVENOUS
  Administered 2015-12-23 (×3): 10 mg via INTRAVENOUS

## 2015-12-23 MED ORDER — PHENYLEPHRINE 40 MCG/ML (10ML) SYRINGE FOR IV PUSH (FOR BLOOD PRESSURE SUPPORT)
PREFILLED_SYRINGE | INTRAVENOUS | Status: AC
  Filled 2015-12-23: qty 10

## 2015-12-23 MED ORDER — KETOROLAC TROMETHAMINE 15 MG/ML IJ SOLN
15.0000 mg | Freq: Four times a day (QID) | INTRAMUSCULAR | Status: AC
  Administered 2015-12-23 – 2015-12-25 (×6): 15 mg via INTRAVENOUS
  Filled 2015-12-23 (×6): qty 1

## 2015-12-23 MED ORDER — BUPIVACAINE-EPINEPHRINE (PF) 0.25% -1:200000 IJ SOLN
INTRAMUSCULAR | Status: AC
  Filled 2015-12-23: qty 30

## 2015-12-23 MED ORDER — FENTANYL CITRATE (PF) 250 MCG/5ML IJ SOLN
INTRAMUSCULAR | Status: DC | PRN
  Administered 2015-12-23: 50 ug via INTRAVENOUS
  Administered 2015-12-23: 25 ug via INTRAVENOUS
  Administered 2015-12-23: 100 ug via INTRAVENOUS
  Administered 2015-12-23: 50 ug via INTRAVENOUS
  Administered 2015-12-23: 25 ug via INTRAVENOUS
  Administered 2015-12-23: 50 ug via INTRAVENOUS
  Administered 2015-12-23: 100 ug via INTRAVENOUS

## 2015-12-23 MED ORDER — CIPROFLOXACIN IN D5W 400 MG/200ML IV SOLN
INTRAVENOUS | Status: AC
  Filled 2015-12-23: qty 200

## 2015-12-23 MED ORDER — FENTANYL CITRATE (PF) 250 MCG/5ML IJ SOLN
INTRAMUSCULAR | Status: AC
  Filled 2015-12-23: qty 5

## 2015-12-23 MED ORDER — CEFAZOLIN SODIUM-DEXTROSE 2-4 GM/100ML-% IV SOLN
2.0000 g | INTRAVENOUS | Status: AC
  Administered 2015-12-23: 2 g via INTRAVENOUS
  Filled 2015-12-23: qty 100

## 2015-12-23 MED ORDER — DOCUSATE SODIUM 100 MG PO CAPS
100.0000 mg | ORAL_CAPSULE | Freq: Two times a day (BID) | ORAL | Status: DC
  Administered 2015-12-23 – 2015-12-25 (×4): 100 mg via ORAL
  Filled 2015-12-23 (×4): qty 1

## 2015-12-23 MED ORDER — LACTATED RINGERS IV SOLN
INTRAVENOUS | Status: DC | PRN
Start: 2015-12-23 — End: 2015-12-23
  Administered 2015-12-23 (×3): via INTRAVENOUS

## 2015-12-23 MED ORDER — POLYVINYL ALCOHOL 1.4 % OP SOLN
1.0000 [drp] | OPHTHALMIC | Status: DC | PRN
  Filled 2015-12-23: qty 15

## 2015-12-23 MED ORDER — HYDROMORPHONE HCL 1 MG/ML IJ SOLN
0.2500 mg | INTRAMUSCULAR | Status: DC | PRN
  Administered 2015-12-23: 0.5 mg via INTRAVENOUS
  Administered 2015-12-23 (×2): 0.25 mg via INTRAVENOUS

## 2015-12-23 MED ORDER — ROCURONIUM BROMIDE 10 MG/ML (PF) SYRINGE
PREFILLED_SYRINGE | INTRAVENOUS | Status: AC
  Filled 2015-12-23: qty 20

## 2015-12-23 MED ORDER — SENNA 8.6 MG PO TABS: 1.0000 | ORAL_TABLET | Freq: Two times a day (BID) | ORAL | 1 refills | Status: AC

## 2015-12-23 MED ORDER — PROPOFOL 10 MG/ML IV BOLUS
INTRAVENOUS | Status: AC
  Filled 2015-12-23: qty 20

## 2015-12-23 MED ORDER — SENNA 8.6 MG PO TABS: 1.0000 | ORAL_TABLET | Freq: Two times a day (BID) | ORAL | 1 refills | Status: DC

## 2015-12-23 MED ORDER — SUGAMMADEX SODIUM 200 MG/2ML IV SOLN
INTRAVENOUS | Status: AC
  Filled 2015-12-23: qty 4

## 2015-12-23 MED ORDER — CIPROFLOXACIN IN D5W 400 MG/200ML IV SOLN
400.0000 mg | INTRAVENOUS | Status: AC
  Administered 2015-12-23: 400 mg via INTRAVENOUS

## 2015-12-23 MED ORDER — MORPHINE SULFATE (PF) 2 MG/ML IV SOLN
2.0000 mg | INTRAVENOUS | Status: DC | PRN
  Administered 2015-12-24 – 2015-12-25 (×2): 2 mg via INTRAVENOUS
  Filled 2015-12-23 (×2): qty 1

## 2015-12-23 MED ORDER — ONDANSETRON HCL 4 MG/2ML IJ SOLN
INTRAMUSCULAR | Status: DC | PRN
  Administered 2015-12-23: 4 mg via INTRAVENOUS

## 2015-12-23 MED ORDER — SUGAMMADEX SODIUM 200 MG/2ML IV SOLN
INTRAVENOUS | Status: DC | PRN
  Administered 2015-12-23: 200 mg via INTRAVENOUS

## 2015-12-23 MED ORDER — BUPIVACAINE LIPOSOME 1.3 % IJ SUSP
20.0000 mL | Freq: Once | INTRAMUSCULAR | Status: AC
Start: 2015-12-23 — End: 2015-12-23
  Administered 2015-12-23: 20 mL
  Filled 2015-12-23: qty 20

## 2015-12-23 MED ORDER — FENTANYL CITRATE (PF) 100 MCG/2ML IJ SOLN
INTRAMUSCULAR | Status: AC
  Filled 2015-12-23: qty 2

## 2015-12-23 MED ORDER — ONDANSETRON HCL 4 MG/2ML IJ SOLN: 4.0000 mg | INTRAMUSCULAR | Status: DC | PRN

## 2015-12-23 MED ORDER — MIDAZOLAM HCL 2 MG/2ML IJ SOLN
INTRAMUSCULAR | Status: DC | PRN
Start: 2015-12-23 — End: 2015-12-23
  Administered 2015-12-23: 2 mg via INTRAVENOUS

## 2015-12-23 MED ORDER — PHENYLEPHRINE HCL 10 MG/ML IJ SOLN
INTRAMUSCULAR | Status: DC | PRN
  Administered 2015-12-23 (×2): 80 ug via INTRAVENOUS
  Administered 2015-12-23: 40 ug via INTRAVENOUS
  Administered 2015-12-23: 80 ug via INTRAVENOUS

## 2015-12-23 MED ORDER — MIDAZOLAM HCL 2 MG/2ML IJ SOLN
INTRAMUSCULAR | Status: AC
  Filled 2015-12-23: qty 2

## 2015-12-23 MED ORDER — BUPIVACAINE-EPINEPHRINE (PF) 0.25% -1:200000 IJ SOLN
INTRAMUSCULAR | Status: DC | PRN
  Administered 2015-12-23: 16 mL

## 2015-12-23 MED ORDER — DEXAMETHASONE SODIUM PHOSPHATE 10 MG/ML IJ SOLN
INTRAMUSCULAR | Status: DC | PRN
  Administered 2015-12-23: 10 mg via INTRAVENOUS

## 2015-12-23 MED ORDER — LACTATED RINGERS IR SOLN
Status: DC | PRN
  Administered 2015-12-23: 1000 mL

## 2015-12-23 MED ORDER — HYDROMORPHONE HCL 1 MG/ML IJ SOLN
INTRAMUSCULAR | Status: AC
  Administered 2015-12-23: 0.25 mg via INTRAVENOUS
  Filled 2015-12-23: qty 1

## 2015-12-23 MED ORDER — PROMETHAZINE HCL 25 MG/ML IJ SOLN: 6.2500 mg | INTRAMUSCULAR | Status: DC | PRN

## 2015-12-23 MED ORDER — CEFAZOLIN IN D5W 1 GM/50ML IV SOLN
1.0000 g | Freq: Three times a day (TID) | INTRAVENOUS | Status: AC
  Administered 2015-12-23 – 2015-12-24 (×2): 1 g via INTRAVENOUS
  Filled 2015-12-23 (×2): qty 50

## 2015-12-23 MED ORDER — ACETAMINOPHEN 10 MG/ML IV SOLN
INTRAVENOUS | Status: AC
  Administered 2015-12-23: 1000 mg via INTRAVENOUS
  Filled 2015-12-23: qty 100

## 2015-12-23 MED ORDER — PROPOFOL 10 MG/ML IV BOLUS
INTRAVENOUS | Status: DC | PRN
  Administered 2015-12-23: 20 mg via INTRAVENOUS
  Administered 2015-12-23: 100 mg via INTRAVENOUS
  Administered 2015-12-23: 20 mg via INTRAVENOUS

## 2015-12-23 MED ORDER — MORPHINE SULFATE (PF) 10 MG/ML IV SOLN
2.0000 mg | INTRAVENOUS | Status: DC | PRN
  Administered 2015-12-23 (×2): 2 mg via INTRAVENOUS
  Filled 2015-12-23 (×2): qty 1

## 2015-12-23 MED ORDER — LIDOCAINE 2% (20 MG/ML) 5 ML SYRINGE
INTRAMUSCULAR | Status: DC | PRN
  Administered 2015-12-23: 40 mg via INTRAVENOUS

## 2015-12-23 MED ORDER — DEXTROSE-NACL 5-0.45 % IV SOLN
INTRAVENOUS | Status: DC
  Administered 2015-12-23 – 2015-12-25 (×5): via INTRAVENOUS

## 2015-12-23 MED ORDER — ACETAMINOPHEN 10 MG/ML IV SOLN
1000.0000 mg | Freq: Four times a day (QID) | INTRAVENOUS | Status: AC
  Administered 2015-12-23 – 2015-12-24 (×3): 1000 mg via INTRAVENOUS
  Filled 2015-12-23 (×3): qty 100

## 2015-12-23 MED ORDER — OXYCODONE-ACETAMINOPHEN 5-325 MG PO TABS
1.0000 | ORAL_TABLET | ORAL | Status: DC | PRN
  Administered 2015-12-24 (×2): 2 via ORAL
  Administered 2015-12-24 (×2): 1 via ORAL
  Administered 2015-12-25 (×2): 2 via ORAL
  Filled 2015-12-23 (×3): qty 2
  Filled 2015-12-23: qty 1
  Filled 2015-12-23 (×3): qty 2

## 2015-12-23 SURGICAL SUPPLY — 66 items
APL ESCP 34 STRL LF DISP (HEMOSTASIS) ×1
APL SRG 38 LTWT LNG FL B (MISCELLANEOUS) ×1
APPLICATOR ARISTA FLEXITIP XL (MISCELLANEOUS) ×3 IMPLANT
APPLICATOR SURGIFLO ENDO (HEMOSTASIS) ×3 IMPLANT
APPLIER CLIP ROT 10 11.4 M/L (STAPLE)
APR CLP MED LRG 11.4X10 (STAPLE)
BAG LAPAROSCOPIC 12 15 PORT 16 (BASKET) ×1 IMPLANT
BAG RETRIEVAL 12/15 (BASKET) ×2
BAG RETRIEVAL 12/15MM (BASKET) ×1
BAG SPEC THK2 15X12 ZIP CLS (MISCELLANEOUS)
BAG ZIPLOCK 12X15 (MISCELLANEOUS) ×1 IMPLANT
BLADE EXTENDED COATED 6.5IN (ELECTRODE) ×2 IMPLANT
BLADE SURG SZ10 CARB STEEL (BLADE) ×3 IMPLANT
CHLORAPREP W/TINT 26ML (MISCELLANEOUS) ×3 IMPLANT
CLIP APPLIE ROT 10 11.4 M/L (STAPLE) IMPLANT
CLIP LIGATING HEM O LOK PURPLE (MISCELLANEOUS) ×5 IMPLANT
CLIP LIGATING HEMO LOK XL GOLD (MISCELLANEOUS) ×4 IMPLANT
CLIP LIGATING HEMO O LOK GREEN (MISCELLANEOUS) ×5 IMPLANT
COVER SURGICAL LIGHT HANDLE (MISCELLANEOUS) ×3 IMPLANT
CUTTER FLEX LINEAR 45M (STAPLE) ×2 IMPLANT
DECANTER SPIKE VIAL GLASS SM (MISCELLANEOUS) ×3 IMPLANT
DRAIN CHANNEL 10F 3/8 F FF (DRAIN) IMPLANT
DRAPE INCISE IOBAN 66X45 STRL (DRAPES) ×3 IMPLANT
DRAPE WARM FLUID 44X44 (DRAPE) IMPLANT
ELECT PENCIL ROCKER SW 15FT (MISCELLANEOUS) ×3 IMPLANT
ELECT REM PT RETURN 9FT ADLT (ELECTROSURGICAL) ×3
ELECTRODE REM PT RTRN 9FT ADLT (ELECTROSURGICAL) ×1 IMPLANT
EVACUATOR SILICONE 100CC (DRAIN) IMPLANT
GLOVE BIOGEL M STRL SZ7.5 (GLOVE) ×3 IMPLANT
GOWN STRL REUS W/TWL LRG LVL3 (GOWN DISPOSABLE) ×6 IMPLANT
HEMOSTAT ARISTA ABSORB 3G PWDR (MISCELLANEOUS) ×3 IMPLANT
HEMOSTAT SURGICEL 4X8 (HEMOSTASIS) IMPLANT
IRRIG SUCT STRYKERFLOW 2 WTIP (MISCELLANEOUS) ×3
IRRIGATION SUCT STRKRFLW 2 WTP (MISCELLANEOUS) ×1 IMPLANT
KIT BASIN OR (CUSTOM PROCEDURE TRAY) ×3 IMPLANT
LIQUID BAND (GAUZE/BANDAGES/DRESSINGS) ×3 IMPLANT
MANIFOLD NEPTUNE II (INSTRUMENTS) ×3 IMPLANT
PAD POSITIONING PINK XL (MISCELLANEOUS) ×2 IMPLANT
POSITIONER SURGICAL ARM (MISCELLANEOUS) ×2 IMPLANT
RELOAD 45 VASCULAR/THIN (ENDOMECHANICALS) ×3 IMPLANT
RELOAD STAPLE 45 2.5 WHT GRN (ENDOMECHANICALS) IMPLANT
RELOAD STAPLE 45 3.5 BLU ETS (ENDOMECHANICALS) IMPLANT
RELOAD STAPLE TA45 3.5 REG BLU (ENDOMECHANICALS) ×3 IMPLANT
RETRACTOR LAPSCP 12X46 CVD (ENDOMECHANICALS) IMPLANT
RTRCTR LAPSCP 12X46 CVD (ENDOMECHANICALS)
SCISSORS LAP 5X35 DISP (ENDOMECHANICALS) ×1 IMPLANT
SHEARS HARMONIC ACE PLUS 36CM (ENDOMECHANICALS) ×3 IMPLANT
SLEEVE XCEL OPT CAN 5 100 (ENDOMECHANICALS) ×6 IMPLANT
SPONGE LAP 4X18 X RAY DECT (DISPOSABLE) IMPLANT
SPONGE SURGIFOAM ABS GEL 100 (HEMOSTASIS) IMPLANT
SUT ETHILON 3 0 PS 1 (SUTURE) IMPLANT
SUT MNCRL AB 4-0 PS2 18 (SUTURE) ×6 IMPLANT
SUT PDS AB 0 CT1 36 (SUTURE) ×6 IMPLANT
SUT VIC AB 2-0 CT1 27 (SUTURE) ×3
SUT VIC AB 2-0 CT1 27XBRD (SUTURE) ×1 IMPLANT
SUT VICRYL 0 UR6 27IN ABS (SUTURE) ×9 IMPLANT
TAPE CLOTH 4X10 WHT NS (GAUZE/BANDAGES/DRESSINGS) IMPLANT
TOWEL OR 17X26 10 PK STRL BLUE (TOWEL DISPOSABLE) ×3 IMPLANT
TOWEL OR NON WOVEN STRL DISP B (DISPOSABLE) ×3 IMPLANT
TRAY FOLEY CATH 16FR SILVER (SET/KITS/TRAYS/PACK) ×1 IMPLANT
TRAY FOLEY W/METER SILVER 16FR (SET/KITS/TRAYS/PACK) ×3 IMPLANT
TRAY LAPAROSCOPIC (CUSTOM PROCEDURE TRAY) ×3 IMPLANT
TROCAR BLADELESS OPT 5 100 (ENDOMECHANICALS) ×3 IMPLANT
TROCAR UNIVERSAL OPT 12M 100M (ENDOMECHANICALS) ×2 IMPLANT
TROCAR XCEL 12X100 BLDLESS (ENDOMECHANICALS) ×3 IMPLANT
YANKAUER SUCT BULB TIP 10FT TU (MISCELLANEOUS) ×3 IMPLANT

## 2015-12-23 NOTE — H&P (Signed)
UPJ obstruction  HPI: Lisa Hayes is a 45 year-old female established patient who is here for further eval and management of UPJ obstruction.  The problem is on the right side. She has had stent for treatment of her ureteropelvic junction obstruction.   She does have a history of urinary infections. She has not had kidney stones. She is not having flank pain. Her renal function is normal.   Her last radiologic test to evaluate the kidneys was 11/12/2015.   The patient has been tolerating her stent without significant comorbidity. She does have urinary frequency/urgency. She denies any significant gross hematuria. She is not havingAny dysuria or ongoing fevers.   2009 the patient was diagnosed with a right UPJ obstruction and at that time had 3% function on the right side. She opted to follow the kidney and dilated any further intervention until she developed infection or reason to intervene.   The patient more recently developed UTI-like symptoms with severe right flank pain and dysuria. She was having intermittent fevers. She presented to the ER and was started on antibiotics. A CT scan at that time demonstrated a large shock did right renal pelvis with minimal parenchyma. Her fevers and symptoms did not resolve with antibiotics. She subsequently had a right ureteral stent placed by Dr. Retta Diones. Her symptoms improved significantly at that point.   The patient is otherwise healthy with no significant past medical history. She takes derivative Ritalin for her attention deficit disorder. She has completed her antibiotics for her UTI. She takes no other medications.   The patient has a past surgical history for laparoscopic appendectomy.       ALLERGIES: No Allergies    MEDICATIONS: Oxybutynin Chloride  Vicodin  Vyvanse 60 mg capsule     GU PSH: Cystoscopy Insert Stent, Right - 11/17/2015    NON-GU PSH: Appendectomy    GU PMH: Hydronephrosis with ureteral stricture, not elsewhere  classified - 11/16/2015 Pyonephrosis - 11/16/2015 Disorder Kidney/ureter, Unspec, Nonfunctioning kidney - 2014, Nonfunctioning kidney, - 2014 Dysuria, Dysuria - 2014 Stress Incontinence, M/F, Female stress incontinence - 2014 Urge incontinence, Urge incontinence of urine - 2014 Urinary Frequency, Increased urinary frequency - 2014 Urinary Urgency, Urinary urgency - 2014      PMH Notes:  2008-05-04 14:15:25 - Note: Normal Routine History And Physical Adult   NON-GU PMH: Muscle weakness (generalized), Muscle weakness - 2014    FAMILY HISTORY: 2 daughters - Runs in Family 2 sons - Runs in Family Heart Disease - Father Hypertension - Mother, Father Pure Hypercholesterolemia - Father   SOCIAL HISTORY: Marital Status: Married Current Smoking Status: Patient has never smoked.  Social Drinker.      Notes: Living Independently With Spouse, Self-reliant In Usual Daily Activities, Exercise Habits, Activities Of Daily Living, Occupation:, Alcohol Use, Marital History - Currently Married, Tobacco Use, Caffeine Use   REVIEW OF SYSTEMS:    GU Review Female:   Patient reports frequent urination, hard to postpone urination, get up at night to urinate, leakage of urine, stream starts and stops, trouble starting your stream, and have to strain to urinate. Patient denies burning /pain with urination and currently pregnant.  Gastrointestinal (Upper):   Patient denies nausea, vomiting, and indigestion/ heartburn.  Gastrointestinal (Lower):   Patient denies diarrhea and constipation.  Constitutional:   Patient denies fever, night sweats, weight loss, and fatigue.  Skin:   Patient denies skin rash/ lesion and itching.  Eyes:   Patient denies blurred vision and double vision.  Ears/ Nose/  Throat:   Patient denies sore throat and sinus problems.  Hematologic/Lymphatic:   Patient denies swollen glands and easy bruising.  Cardiovascular:   Patient denies leg swelling and chest pains.  Respiratory:   Patient  denies cough and shortness of breath.  Endocrine:   Patient denies excessive thirst.  Musculoskeletal:   Patient denies back pain and joint pain.  Neurological:   Patient denies headaches and dizziness.  Psychologic:   Patient denies depression and anxiety.   VITAL SIGNS:      12/12/2015 04:07 PM  Weight 180 lb / 81.65 kg  Height 68 in / 172.72 cm  BP 126/76 mmHg  Pulse 72 /min  BMI 27.4 kg/m   MULTI-SYSTEM PHYSICAL EXAMINATION:    Constitutional: Well-nourished. No physical deformities. Normally developed. Good grooming.  Neck: Neck symmetrical, not swollen. Normal tracheal position.  Respiratory: No labored breathing, no use of accessory muscles. Clear to auscultation  Cardiovascular: Normal temperature, normal extremity pulses, no swelling, no varicosities. Normal rhythm, rate  Lymphatic: No enlargement of neck, axillae, groin.  Skin: No paleness, no jaundice, no cyanosis. No lesion, no ulcer, no rash.  Neurologic / Psychiatric: Oriented to time, oriented to place, oriented to person. No depression, no anxiety, no agitation.  Gastrointestinal: No mass, no tenderness, no rigidity, non obese abdomen.  Eyes: Normal conjunctivae. Normal eyelids.  Ears, Nose, Mouth, and Throat: Left ear no scars, no lesions, no masses. Right ear no scars, no lesions, no masses. Nose no scars, no lesions, no masses. Normal hearing. Normal lips.  Musculoskeletal: Normal gait and station of head and neck.     PAST DATA REVIEWED:  Source Of History:  Patient  X-Ray Review: C.T. Abdomen/Pelvis: Reviewed Films. Discussed With Patient. Large right obstructed renal pelvis with an otherwise atrophic right kidney. Lasix Renogram: Reviewed Films. Discussed With Patient. 2009- 3% split right renal function    PROCEDURES:          Urinalysis w/Scope - 81001 Dipstick Dipstick Cont'd Micro  Specimen: Voided Bilirubin: Neg WBC/hpf: NS (Not Seen)  Color: Straw Ketones: Neg RBC/hpf: 3-10/hpf  Appearance: Clear  Blood: 3+ Bacteria: NS (Not Seen)  Specific Gravity: <= 1.005 Protein: Neg Cystals: NS (Not Seen)  pH: 6.0 Urobilinogen: 0.2 Casts: NS (Not Seen)  Glucose: Neg Nitrites: Neg Trichomonas: Not Present    Leukocyte Esterase: Neg Mucous: Not Present      Epithelial Cells: 0-5/hpf      Yeast: NS (Not Seen)      Sperm: Not Present    ASSESSMENT:      ICD-10 Details  1 GU:   UPJ Obstruction, Congenital - Q62.11 Right, The patient has a congenital right UPJ obstruction and has recently had recurrent right kidney infections. She has minimal right renal function.   PLAN:           Document Letter(s):  Created for Patient: Clinical Summary    I detailed the various treatment options with the patient and ultimately I recommended a laparoscopic radical nephrectomy. I went over this operation in detail with the patient including the risks and benefits. We discussed port position and I outlined the position of the 4 planned trocars. I also explained to them that they will need extraction incision which typically is in the lower quadrant, connecting to the two lower lateral incisions. I discussed the actual surgery with them and we went over the various structures that are in intimate association with the kidney and the risk of damage thereof. I outlined the risk  of injury to the major nerves and vessels in proximity to the kidney. I explained the patient the expected hospital course. I told them that they should plan to be in the hospital at least 2-3 days. Further, I told them that they would likely need approximately 1 month to fully recover.

## 2015-12-23 NOTE — Anesthesia Preprocedure Evaluation (Addendum)
Anesthesia Evaluation  Patient identified by MRN, date of birth, ID band Patient awake    Reviewed: Allergy & Precautions, NPO status , Patient's Chart, lab work & pertinent test results  Airway Mallampati: II  TM Distance: >3 FB Neck ROM: Full    Dental no notable dental hx. (+) Dental Advisory Given   Pulmonary neg pulmonary ROS,    Pulmonary exam normal breath sounds clear to auscultation       Cardiovascular negative cardio ROS Normal cardiovascular exam Rhythm:Regular Rate:Normal     Neuro/Psych negative neurological ROS  negative psych ROS   GI/Hepatic negative GI ROS, Neg liver ROS,   Endo/Other  negative endocrine ROS  Renal/GU Renal disease     Musculoskeletal negative musculoskeletal ROS (+)   Abdominal   Peds  Hematology negative hematology ROS (+)   Anesthesia Other Findings   Reproductive/Obstetrics negative OB ROS                            Anesthesia Physical  Anesthesia Plan  ASA: II  Anesthesia Plan: General   Post-op Pain Management:    Induction: Intravenous  Airway Management Planned: Oral ETT  Additional Equipment:   Intra-op Plan:   Post-operative Plan: Extubation in OR  Informed Consent: I have reviewed the patients History and Physical, chart, labs and discussed the procedure including the risks, benefits and alternatives for the proposed anesthesia with the patient or authorized representative who has indicated his/her understanding and acceptance.   Dental advisory given  Plan Discussed with: CRNA  Anesthesia Plan Comments:         Anesthesia Quick Evaluation

## 2015-12-23 NOTE — Op Note (Signed)
Preoperative diagnosis:  1. 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 same   Procedure: 1. Laparoscopic right same nephrectomy  Surgeon: Crist FatBenjamin W. Herrick, MD Resident Assistant: Dr. Dinah BeersMatt Macy, MD  Anesthesia: General  Complications: None  Intraoperative findings: atrophic right kidney  EBL: 25cc  Specimens:  Right kidney and proximal ureter  Indication: Lisa Hayes is a 45 y.o. patient with chronically infected and atrophic right kidney.  After reviewing the management options for treatment, he elected to proceed with the above surgical procedure(s). We have discussed the potential benefits and risks of the procedure, side effects of the proposed treatment, the likelihood of the patient achieving the goals of the procedure, and any potential problems that might occur during the procedure or recuperation. Informed consent has been obtained.  Description of procedure:  A site was selected lateral to the umbilicus for placement of the camera port. This was placed using a standard open Hassan technique which allowed entry into the peritoneal cavity under direct vision and without difficulty. A 12 mm Hassan cannula was placed and a pneumoperitoneum established. The camera was then used to inspect the abdomen and there was no evidence of any intra-abdominal injuries or other abnormalities. The remaining abdominal ports were then placed under visual guidance.  A second 12 mm port was placed in the right lower quadrant approximately 8 cm away from the camera. A 5 mm port was placed in the right upper quadrant again 8 cm away from the camera.  An additional 5 mm port was then placed in the right lower quadrant at the anterior axillary line lateral to the 12 mm port. All ports were placed under direct vision without difficulty.   The white line of Toldt was incised allowing the colon to be mobilized medially and the plane between the  mesocolon and the anterior layer of Gerota's fascia to be developed and the kidney exposed. The ureter and gonadal vein were identified inferiorly and the ureter was lifted anteriorly off the psoas muscle. Dissection proceeded superiorly along the gonadal vein until the renal vein was identified. The renal hilum was then carefully isolated with a combination of blunt and sharp dissectiong allowing the renal arterial and venous structures to be separated and isolated.   The renal hilum was then isolated and also ligated and divided with a 45 mm Flex ETS stapler.   Gerota's fascia was intentionally entered superiorly and the space between the adrenal gland and the kidney was developed allowing the adrenal gland to be spared. The hepatorenal ligaments were divided.. The lateral and posterior attachements to the kidney were then divided. Once the kidney was free from its attachments  40cc of 0.25% rupivocaine was then injected into the right anterior axillary line b/w the iliac crest and the twelfth rib under laparoscopic guidance. The layer between the tranversus abdominus and the internal oblique was targeted.   The kidney/ureter specimen was then placed into a 15 mm Endocatch II retrieval bag. The renal hilum, liver, adrenal bed and gonadal vein areas were each inspected and hemostasis was ensured with the pneomperitoneal pressures lowered. All the ports were then removed under visual guidance. The lateral 12 mm port and 5 mm port were then connected sharply with a 15 blade. Then opened this incision down to the external oblique fascia. We then spread the muscle fibers down to the internal oblique fascia which we then opened with cautery. These were then spread in all muscle spared  and the posterior peritoneum was opened. The rectus muscle was pulled medially. The specimen was then removed through these incision. The internal oblique fascia was then closed with a 0 Vicryl in a running fashion. The external  oblique fascia was then closed with a 0 PDS in a running fashion.  The camera port fascia was closed using a 0 Vicryl.  All incisions were injected with local anesthetic and reapproximated at the skin with 4-0 monocryl sutures. Dermabond was applied to the skin. The patient tolerated the procedure well and without complications and was transferred to the recovery unit in satisfactory condition.   Crist Fat, M.D.

## 2015-12-23 NOTE — Transfer of Care (Signed)
Immediate Anesthesia Transfer of Care Note  Patient: Lisa Hayes  Procedure(s) Performed: Procedure(s): RIGHT SIMPLE LAPAROSCOPIC NEPHRECTOMY (Right)  Patient Location: PACU  Anesthesia Type:General  Level of Consciousness: sedated  Airway & Oxygen Therapy: Patient Spontanous Breathing and Patient connected to face mask oxygen  Post-op Assessment: Report given to RN and Post -op Vital signs reviewed and stable  Post vital signs: Reviewed and stable  Last Vitals:  Vitals:   12/23/15 0811  BP: 123/73  Pulse: 63  Resp: 16  Temp: 36.6 C    Last Pain:  Vitals:   12/23/15 0811  TempSrc: Oral         Complications: No apparent anesthesia complications

## 2015-12-23 NOTE — Anesthesia Procedure Notes (Signed)
Procedure Name: Intubation Date/Time: 12/23/2015 10:04 AM Performed by: Lewie LoronGERMEROTH, JOHN Pre-anesthesia Checklist: Patient identified, Emergency Drugs available, Suction available and Patient being monitored Patient Re-evaluated:Patient Re-evaluated prior to inductionOxygen Delivery Method: Circle System Utilized Preoxygenation: Pre-oxygenation with 100% oxygen Intubation Type: IV induction Ventilation: Mask ventilation without difficulty Laryngoscope Size: Mac and 3 Grade View: Grade I Tube type: Oral Number of attempts: 1 Airway Equipment and Method: Stylet Placement Confirmation: ETT inserted through vocal cords under direct vision,  positive ETCO2 and breath sounds checked- equal and bilateral Secured at: 22 cm Tube secured with: Tape Dental Injury: Teeth and Oropharynx as per pre-operative assessment  Comments: Smooth IV induction AM CRNA--- Germeroth intubation---  Atraumatic--- teeth and mouth as preop-- bilat BS

## 2015-12-23 NOTE — Anesthesia Postprocedure Evaluation (Signed)
Anesthesia Post Note  Patient: Lisa Hayes  Procedure(s) Performed: Procedure(s) (LRB): RIGHT SIMPLE LAPAROSCOPIC NEPHRECTOMY (Right)  Patient location during evaluation: PACU Anesthesia Type: General Level of consciousness: sedated and patient cooperative Pain management: pain level controlled Vital Signs Assessment: post-procedure vital signs reviewed and stable Respiratory status: spontaneous breathing Cardiovascular status: stable Anesthetic complications: yes Anesthetic complication details: injury of corneaComments: Mild corneal abrasion on R    Last Vitals:  Vitals:   12/23/15 1500 12/23/15 1519  BP: 112/66 122/73  Pulse: (!) 58 (!) 58  Resp: 18 12  Temp: 36.4 C 36.6 C    Last Pain:  Vitals:   12/23/15 1613  TempSrc:   PainSc: 5                  Lewie LoronJohn Damita Eppard

## 2015-12-23 NOTE — Discharge Instructions (Signed)

## 2015-12-24 LAB — CBC
HCT: 34.1 % — ABNORMAL LOW (ref 36.0–46.0)
HEMOGLOBIN: 11.7 g/dL — AB (ref 12.0–15.0)
MCH: 31 pg (ref 26.0–34.0)
MCHC: 34.3 g/dL (ref 30.0–36.0)
MCV: 90.2 fL (ref 78.0–100.0)
PLATELETS: 191 10*3/uL (ref 150–400)
RBC: 3.78 MIL/uL — ABNORMAL LOW (ref 3.87–5.11)
RDW: 13.5 % (ref 11.5–15.5)
WBC: 11.9 10*3/uL — ABNORMAL HIGH (ref 4.0–10.5)

## 2015-12-24 LAB — BASIC METABOLIC PANEL
Anion gap: 6 (ref 5–15)
BUN: 9 mg/dL (ref 6–20)
CALCIUM: 8.3 mg/dL — AB (ref 8.9–10.3)
CHLORIDE: 106 mmol/L (ref 101–111)
CO2: 25 mmol/L (ref 22–32)
CREATININE: 1 mg/dL (ref 0.44–1.00)
GFR calc Af Amer: >60 mL/min (ref >60)
GFR calc non Af Amer: >60 mL/min (ref >60)
Glucose, Bld: 132 mg/dL — ABNORMAL HIGH (ref 65–99)
Potassium: 4.1 mmol/L (ref 3.5–5.1)
SODIUM: 137 mmol/L (ref 135–145)

## 2015-12-24 MED ORDER — TRAMADOL HCL 50 MG PO TABS: 50.0000 mg | ORAL_TABLET | Freq: Four times a day (QID) | ORAL | 0 refills | Status: DC | PRN

## 2015-12-24 NOTE — Progress Notes (Signed)
1 Day Post-Op Subjective: Patient reports good pain control. Tolerating full liquid diet.   Objective: Vital signs in last 24 hours: Temp:  [97.7 F (36.5 C)-98.3 F (36.8 C)] 98.3 F (36.8 C) (09/02 1503) Pulse Rate:  [57-80] 76 (09/02 1503) Resp:  [16-20] 17 (09/02 1503) BP: (102-121)/(55-73) 109/56 (09/02 1503) SpO2:  [98 %-99 %] 99 % (09/02 1503)  Intake/Output from previous day: 09/01 0701 - 09/02 0700 In: 5757.9 [P.O.:960; I.V.:4197.9; IV Piggyback:400] Out: 2395 [Urine:2345; Blood:50] Intake/Output this shift: No intake/output data recorded.  Physical Exam:  General:alert, cooperative and appears stated age GI: tenderness: RUQ and RLQ Female genitalia: not done Extremities: extremities normal, atraumatic, no cyanosis or edema  Lab Results:  Recent Labs  12/24/15 0514  HGB 11.7*  HCT 34.1*   BMET  Recent Labs  12/24/15 0514  NA 137  K 4.1  CL 106  CO2 25  GLUCOSE 132*  BUN 9  CREATININE 1.00  CALCIUM 8.3*   No results for input(s): LABPT, INR in the last 72 hours. No results for input(s): LABURIN in the last 72 hours. Results for orders placed or performed during the hospital encounter of 12/16/15  Urine culture     Status: None   Collection Time: 12/16/15  1:55 PM  Result Value Ref Range Status   Specimen Description URINE, RANDOM  Final   Special Requests NONE  Final   Culture NO GROWTH [facility]   Final   Report Status 12/17/2015 FINAL  Final    Studies/Results: No results found.  Assessment/Plan: POD#1 Right simple nephrectomy  1. D/C foley 2. Regular diet 3. SLIVF 4. Discharge tomorrow   LOS: 1 day   Wilkie Ayeatrick Kateena Degroote 12/24/2015, 9:01 PM

## 2015-12-25 NOTE — Progress Notes (Signed)
Reviewed discharge information with patient and caregiver. Answered all questions. Patient/caregiver able to teach back medications and reasons to contact MD/911. Patient verbalizes importance of PCP follow up appointment.  Ronie Barnhart M. Mustafa Potts, RN  

## 2015-12-25 NOTE — Discharge Summary (Signed)
Physician Discharge Summary  Patient ID: Lisa Hayes MRN: [id number] DOB/AGE: 08-11-1970 45 y.o.  Admit date: 12/23/2015 Discharge date: 12/25/2015  Admission Diagnoses:  Discharge Diagnoses:  Active Problems:   Ureteropelvic junction (UPJ) obstruction   Discharged Condition: good  Hospital Course: Patient admitted following Right simple lap Nephrectomy. She has done well. She is passing flatus, ambulating, tolerating regular diet and has minimal pain. She is AFVSS.   Consults: None  Significant Diagnostic Studies: none   Treatments: surgery: Right Simple Laparoscopic Nephrectomy   Discharge Exam: Blood pressure 113/76, pulse 64, temperature 97.7 F (36.5 C), temperature source Oral, resp. rate 18, height 5\' 8"  (1.727 m), weight 88.9 kg (196 lb), SpO2 100 %.  NAD, looks well Psych: A&O x 3 Lungs - reg resp rate and effort Neuro: no focal deficits, ambulating around room  Disposition: 01-Home or Self Care  Discharge Instructions    Discharge patient    Complete by:  As directed   To home/self care.       Medication List    STOP taking these medications   HYDROcodone-acetaminophen 5-325 MG tablet Commonly known as:  NORCO/VICODIN   oxybutynin 5 MG tablet Commonly known as:  DITROPAN   sulfamethoxazole-trimethoprim 800-160 MG tablet Commonly known as:  BACTRIM DS,SEPTRA DS     TAKE these medications   docusate sodium 100 MG capsule Commonly known as:  COLACE Take 1 capsule (100 mg total) by mouth 2 (two) times daily.   ondansetron 4 MG disintegrating tablet Commonly known as:  ZOFRAN ODT Take 1 tablet (4 mg total) by mouth every 8 (eight) hours as needed for nausea or vomiting.   polyethylene glycol powder powder Commonly known as:  GLYCOLAX/MIRALAX Take 17 g by mouth 2 (two) times daily. Until daily soft stools  OTC   ranitidine 75 MG tablet Commonly known as:  ZANTAC Take 75 mg by mouth daily as needed for heartburn.   senna 8.6 MG Tabs  tablet Commonly known as:  SENOKOT Take 1 tablet (8.6 mg total) by mouth 2 (two) times daily.   traMADol 50 MG tablet Commonly known as:  ULTRAM Take 1 tablet (50 mg total) by mouth every 6 (six) hours as needed for moderate pain.   VYVANSE 40 MG capsule Generic drug:  lisdexamfetamine Take 40 mg by mouth daily.      Follow-up Information    Crist FatHERRICK, BENJAMIN W, MD Follow up on 01/06/2016.   Specialty:  Urology Why:  3:15pm Contact information: [address] [place] [id number] [phone]           Signed: Jerilee FieldSKRIDGE, Lisa Hayes 12/25/2015, 10:34 AM

## 2016-08-14 IMAGING — CT CT RENAL STONE PROTOCOL
2 of 4 series · 16 of 46 positions shown, 18 images · non-contrast
Comparison: CT abdomen pelvis 03/10/2009.

CLINICAL DATA: Patient with right flank pain and hematuria for 2
days.

EXAM:
CT ABDOMEN AND PELVIS WITHOUT CONTRAST
TECHNIQUE: Multidetector CT imaging of the abdomen and pelvis was performed
following the standard protocol without IV contrast.

[Series 2: axial st · axial · 0.90mm/px · z∈[-526,-26]mm · 13 of 110 slices shown, 15 images]
[im 5/110  soft-tissue]
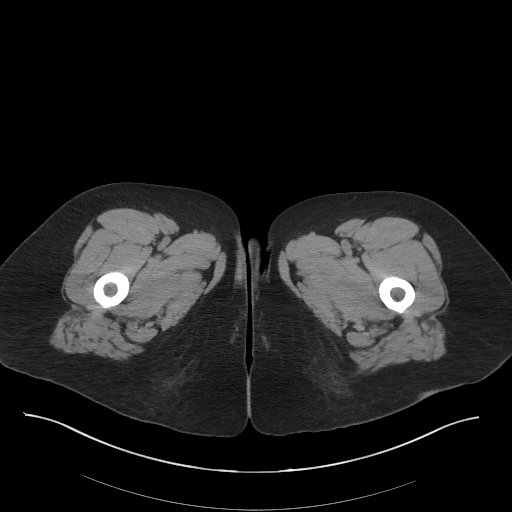
[im 5/110  bone]
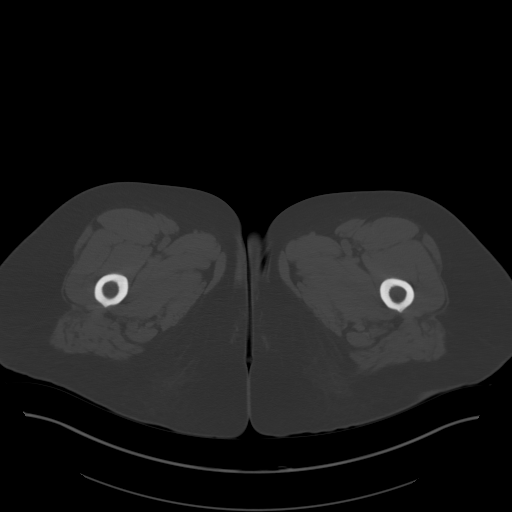
[im 13/110  soft-tissue]
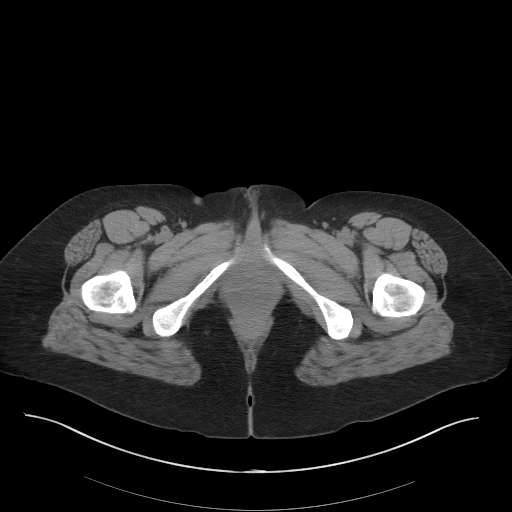
[im 21/110  soft-tissue]
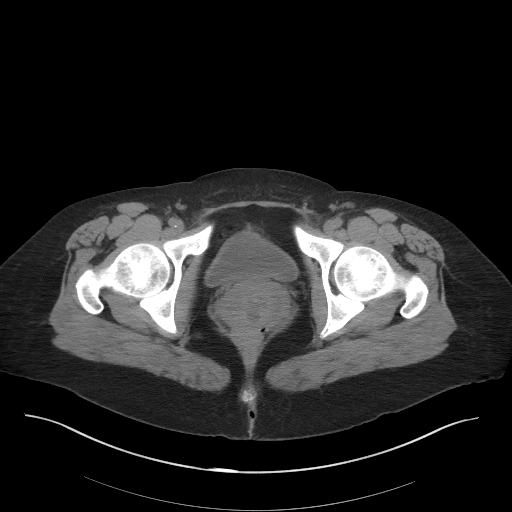
[im 30/110  soft-tissue]
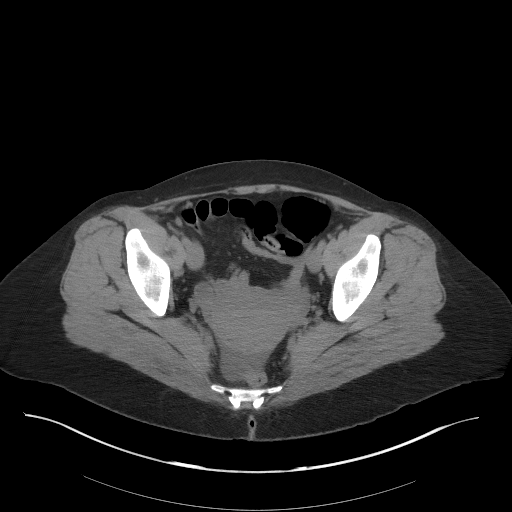
[im 38/110  soft-tissue]
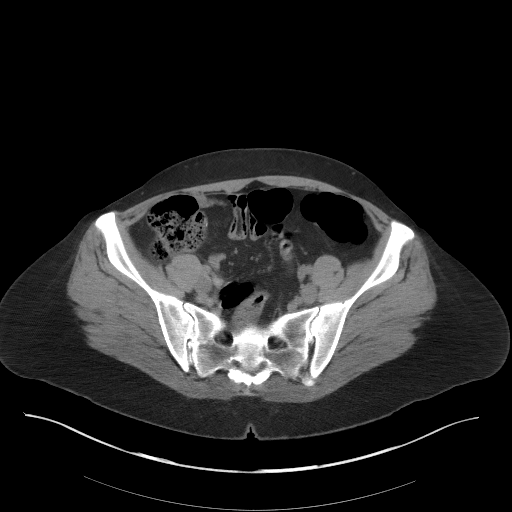
[im 47/110  soft-tissue]
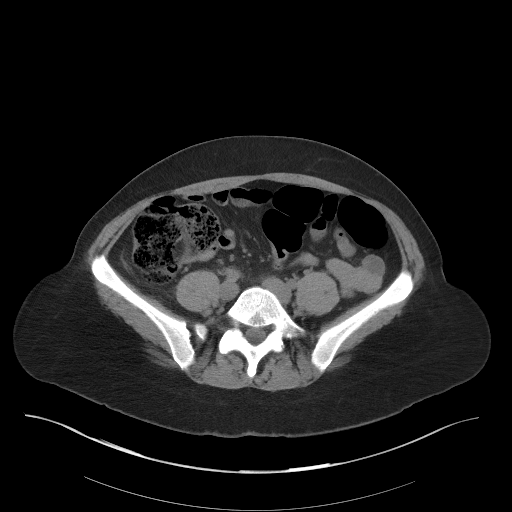
[im 55/110  soft-tissue]
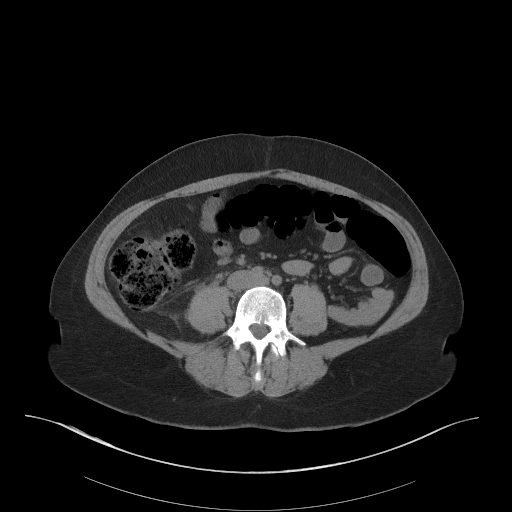
[im 63/110  soft-tissue]
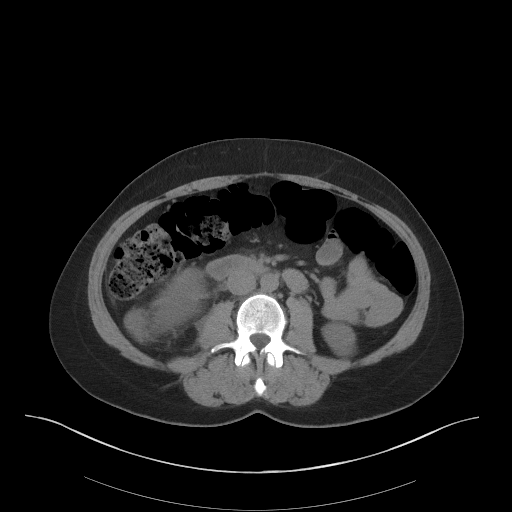
[im 72/110  soft-tissue]
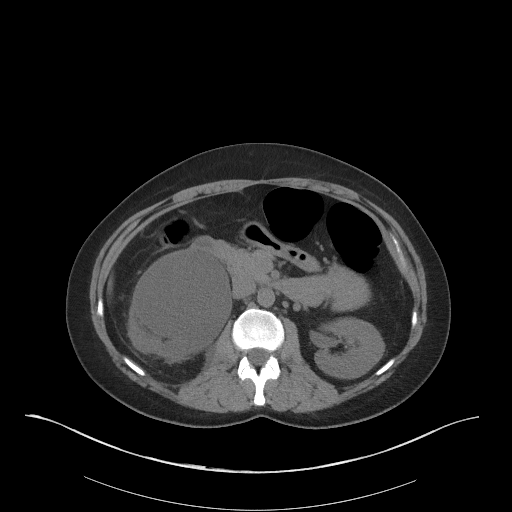
[im 72/110  bone]
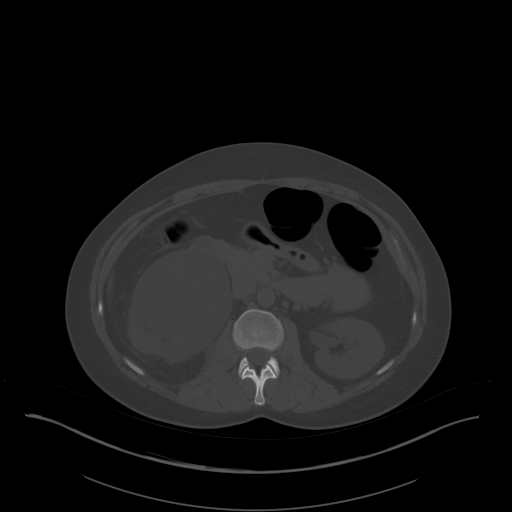
[im 80/110  soft-tissue]
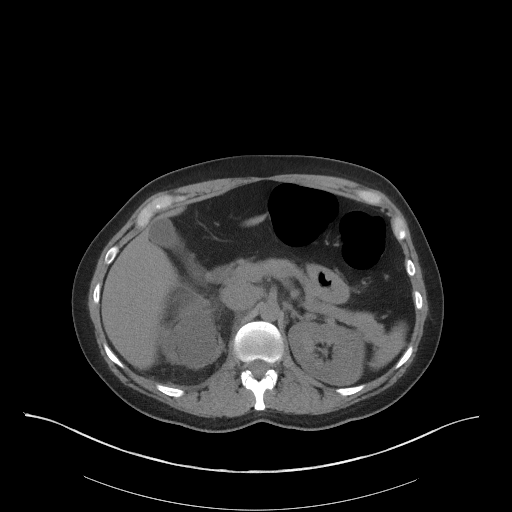
[im 89/110  soft-tissue]
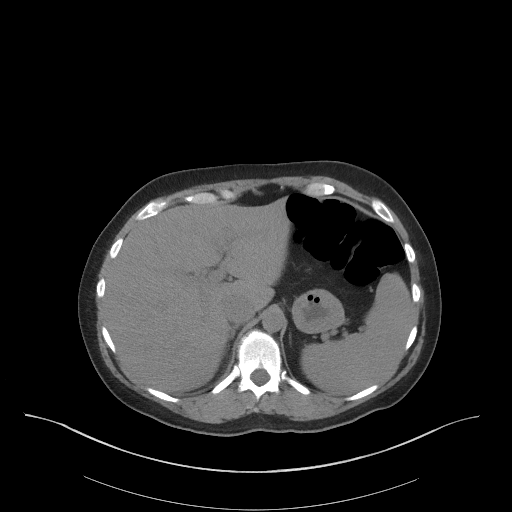
[im 97/110  soft-tissue]
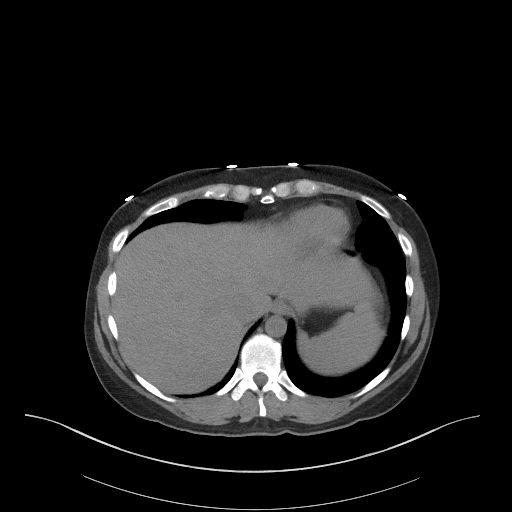
[im 105/110  soft-tissue]
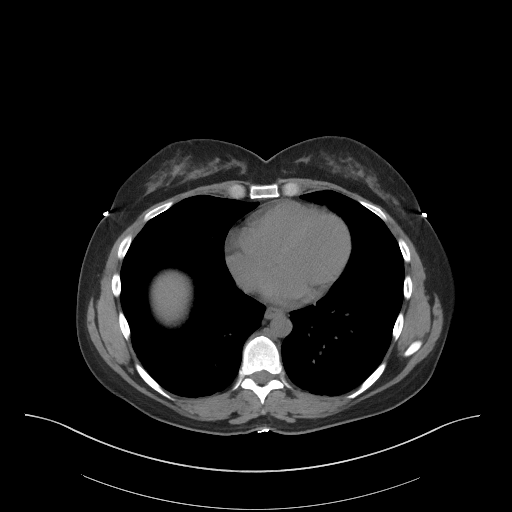

[Series 5: coronal st · coronal · 1.10mm/px · 3 of 87 slices shown]
[im 29/87  soft-tissue]
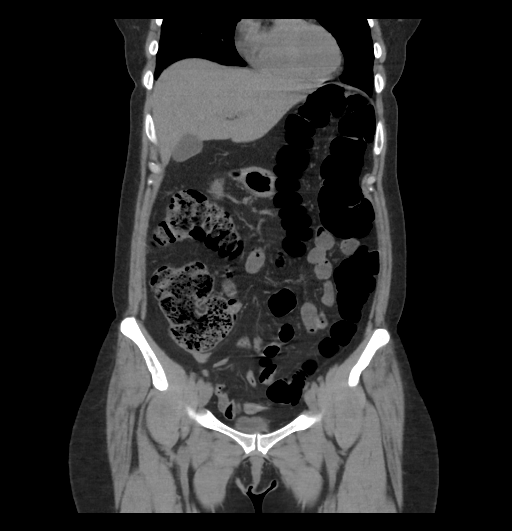
[im 39/87  soft-tissue]
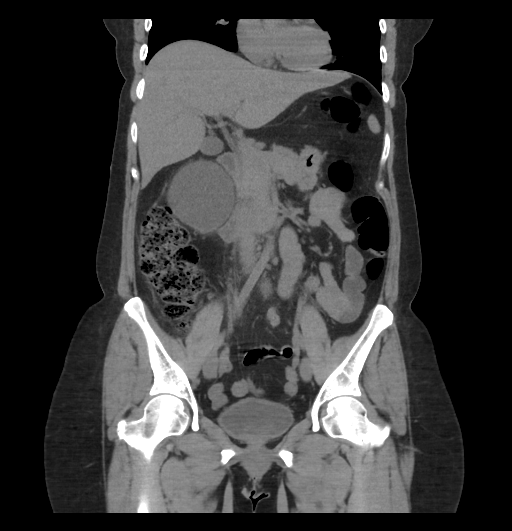
[im 48/87  soft-tissue]
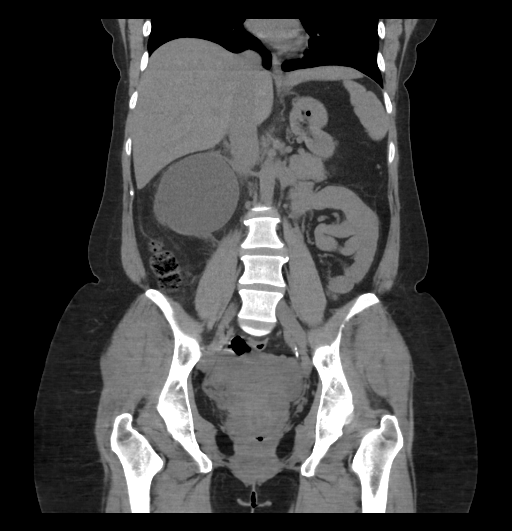

[16 of 46 positions shown; findings below may reference images not displayed]

FINDINGS: Lower chest: Normal heart size. Subpleural ground-glass opacities
within the bilateral lower lobes compatible with atelectasis. No
pleural effusion.

Hepatobiliary: Liver is normal in size and contour. Liver is
diffusely low in attenuation compatible with hepatic steatosis.
Gallbladder is unremarkable. No intrahepatic or extrahepatic biliary
dilatation.

Pancreas: Unremarkable

Spleen: Unremarkable

Adrenals/Urinary Tract: Normal adrenal glands. The left kidney is
normal in size and contour. No left-sided hydronephrosis. Urinary
bladder is decompressed. The right kidney is atrophic. There is
marked dilatation of the right renal collecting system. Extensive
fat stranding surrounding the right renal collecting system, right
kidney and right ureter. No right-sided ureterolithiasis identified.

Stomach/Bowel: No abnormal bowel wall thickening or evidence for
bowel obstruction. No free intraperitoneal air. Normal morphology of
the stomach.

Vascular/Lymphatic: Normal caliber abdominal aorta. No
retroperitoneal lymphadenopathy.

Other: Uterus adnexal structures are unremarkable. Small amount of
free fluid in the pelvis.

Musculoskeletal: No aggressive or acute appearing osseous lesions.
IMPRESSION: There is marked dilatation of the right renal collecting system,
suggestive of UPJ obstruction. There is a significant amount of fat
stranding about the right renal collecting system, right kidney and
right ureter raising the possibility of superimposed infectious
process. Causative etiology for the UPJ obstruction is not
identified on current examination. Transitional cell carcinoma needs
to be excluded as a causative etiology with multi phase
contrast-enhanced CT as well as urinalysis.

Hepatic steatosis.

## 2020-07-25 DIAGNOSIS — R300 Dysuria: Secondary | ICD-10-CM | POA: Diagnosis not present

## 2022-01-04 DIAGNOSIS — Z6836 Body mass index (BMI) 36.0-36.9, adult: Secondary | ICD-10-CM | POA: Diagnosis not present

## 2022-01-04 DIAGNOSIS — Z01419 Encounter for gynecological examination (general) (routine) without abnormal findings: Secondary | ICD-10-CM | POA: Diagnosis not present

## 2022-01-04 DIAGNOSIS — R635 Abnormal weight gain: Secondary | ICD-10-CM | POA: Diagnosis not present

## 2022-01-04 DIAGNOSIS — Z1151 Encounter for screening for human papillomavirus (HPV): Secondary | ICD-10-CM | POA: Diagnosis not present

## 2022-01-04 DIAGNOSIS — Z13228 Encounter for screening for other metabolic disorders: Secondary | ICD-10-CM | POA: Diagnosis not present

## 2022-01-04 DIAGNOSIS — Z124 Encounter for screening for malignant neoplasm of cervix: Secondary | ICD-10-CM | POA: Diagnosis not present

## 2022-01-04 DIAGNOSIS — Z13220 Encounter for screening for lipoid disorders: Secondary | ICD-10-CM | POA: Diagnosis not present

## 2022-01-04 DIAGNOSIS — Z131 Encounter for screening for diabetes mellitus: Secondary | ICD-10-CM | POA: Diagnosis not present

## 2022-01-17 DIAGNOSIS — Z1211 Encounter for screening for malignant neoplasm of colon: Secondary | ICD-10-CM | POA: Diagnosis not present

## 2022-02-13 DIAGNOSIS — Z1211 Encounter for screening for malignant neoplasm of colon: Secondary | ICD-10-CM | POA: Diagnosis not present

## 2022-02-13 DIAGNOSIS — K635 Polyp of colon: Secondary | ICD-10-CM | POA: Diagnosis not present

## 2022-02-19 DIAGNOSIS — Z1231 Encounter for screening mammogram for malignant neoplasm of breast: Secondary | ICD-10-CM | POA: Diagnosis not present

## 2022-03-01 DIAGNOSIS — Z23 Encounter for immunization: Secondary | ICD-10-CM | POA: Diagnosis not present

## 2022-03-01 DIAGNOSIS — Z86010 Personal history of colonic polyps: Secondary | ICD-10-CM | POA: Diagnosis not present

## 2022-03-01 DIAGNOSIS — E7800 Pure hypercholesterolemia, unspecified: Secondary | ICD-10-CM | POA: Diagnosis not present

## 2022-03-01 DIAGNOSIS — R7401 Elevation of levels of liver transaminase levels: Secondary | ICD-10-CM | POA: Diagnosis not present

## 2022-03-01 DIAGNOSIS — Z0000 Encounter for general adult medical examination without abnormal findings: Secondary | ICD-10-CM | POA: Diagnosis not present

## 2022-03-01 DIAGNOSIS — Z905 Acquired absence of kidney: Secondary | ICD-10-CM | POA: Diagnosis not present

## 2022-03-01 DIAGNOSIS — Z833 Family history of diabetes mellitus: Secondary | ICD-10-CM | POA: Diagnosis not present

## 2022-03-05 ENCOUNTER — Telehealth (HOSPITAL_BASED_OUTPATIENT_CLINIC_OR_DEPARTMENT_OTHER): Payer: Self-pay

## 2022-03-05 ENCOUNTER — Other Ambulatory Visit (HOSPITAL_BASED_OUTPATIENT_CLINIC_OR_DEPARTMENT_OTHER): Payer: Self-pay | Admitting: Internal Medicine

## 2022-03-05 DIAGNOSIS — E7800 Pure hypercholesterolemia, unspecified: Secondary | ICD-10-CM

## 2022-03-07 DIAGNOSIS — R7401 Elevation of levels of liver transaminase levels: Secondary | ICD-10-CM | POA: Diagnosis not present

## 2022-03-12 ENCOUNTER — Ambulatory Visit (HOSPITAL_BASED_OUTPATIENT_CLINIC_OR_DEPARTMENT_OTHER)
Admission: RE | Admit: 2022-03-12 | Discharge: 2022-03-12 | Disposition: A | Discharge status: Home / Self Care | Payer: Self-pay | Source: Ambulatory Visit | Attending: Internal Medicine | Admitting: Internal Medicine

## 2022-03-12 DIAGNOSIS — E7800 Pure hypercholesterolemia, unspecified: Secondary | ICD-10-CM | POA: Insufficient documentation

## 2022-03-14 DIAGNOSIS — Z6836 Body mass index (BMI) 36.0-36.9, adult: Secondary | ICD-10-CM | POA: Diagnosis not present

## 2022-03-14 DIAGNOSIS — I2510 Atherosclerotic heart disease of native coronary artery without angina pectoris: Secondary | ICD-10-CM | POA: Diagnosis not present

## 2022-03-14 DIAGNOSIS — F909 Attention-deficit hyperactivity disorder, unspecified type: Secondary | ICD-10-CM | POA: Diagnosis not present

## 2022-03-14 DIAGNOSIS — K760 Fatty (change of) liver, not elsewhere classified: Secondary | ICD-10-CM | POA: Diagnosis not present

## 2022-03-21 ENCOUNTER — Encounter (INDEPENDENT_AMBULATORY_CARE_PROVIDER_SITE_OTHER): Payer: Self-pay

## 2022-04-04 ENCOUNTER — Other Ambulatory Visit (HOSPITAL_COMMUNITY): Payer: Self-pay | Admitting: Gastroenterology

## 2022-04-04 DIAGNOSIS — E782 Mixed hyperlipidemia: Secondary | ICD-10-CM | POA: Diagnosis not present

## 2022-04-04 DIAGNOSIS — E6609 Other obesity due to excess calories: Secondary | ICD-10-CM | POA: Diagnosis not present

## 2022-04-04 DIAGNOSIS — R7989 Other specified abnormal findings of blood chemistry: Secondary | ICD-10-CM

## 2022-04-04 DIAGNOSIS — R7401 Elevation of levels of liver transaminase levels: Secondary | ICD-10-CM | POA: Diagnosis not present

## 2022-04-06 ENCOUNTER — Telehealth: Payer: Self-pay | Admitting: Pharmacist

## 2022-04-06 ENCOUNTER — Ambulatory Visit: Payer: BC Managed Care – PPO | Attending: Cardiovascular Disease | Admitting: Cardiovascular Disease

## 2022-04-06 ENCOUNTER — Encounter: Payer: Self-pay | Admitting: Cardiovascular Disease

## 2022-04-06 VITALS — BP 124/80 | HR 72 | Ht 68.0 in | Wt 230.4 lb

## 2022-04-06 DIAGNOSIS — Z8249 Family history of ischemic heart disease and other diseases of the circulatory system: Secondary | ICD-10-CM

## 2022-04-06 DIAGNOSIS — R931 Abnormal findings on diagnostic imaging of heart and coronary circulation: Secondary | ICD-10-CM | POA: Insufficient documentation

## 2022-04-06 DIAGNOSIS — E782 Mixed hyperlipidemia: Secondary | ICD-10-CM

## 2022-04-06 DIAGNOSIS — E785 Hyperlipidemia, unspecified: Secondary | ICD-10-CM | POA: Insufficient documentation

## 2022-04-06 NOTE — Patient Instructions (Addendum)
Medication Instructions:  Your physician recommends that you continue on your current medications as directed. Please refer to the Current Medication list given to you today.  *If you need a refill on your cardiac medications before your next appointment, please call your pharmacy*   Follow-Up: At [facility], you and your health needs are our priority.  As part of our continuing mission to provide you with exceptional heart care, we have created designated Provider Care Teams.  These Care Teams include your primary Cardiologist (physician) and Advanced Practice Providers (APPs -  Physician Assistants and Nurse Practitioners) who all work together to provide you with the care you need, when you need it.  We recommend signing up for the patient portal called "MyChart".  Sign up information is provided on this After Visit Summary.  MyChart is used to connect with patients for Virtual Visits (Telemedicine).  Patients are able to view lab/test results, encounter notes, upcoming appointments, etc.  Non-urgent messages can be sent to your provider as well.   To learn more about what you can do with MyChart, go to [email or web address].    Your next appointment:   3-4 month(s)  The format for your next appointment:   In Person  Provider:   Nanetta Batty, MD    Other Instructions Our PharmD will reach out to you once PA is done and prescription is sent in.

## 2022-04-06 NOTE — Telephone Encounter (Signed)
PA for Repatha submitted.  Key: [id number]

## 2022-04-06 NOTE — Assessment & Plan Note (Signed)
History of hyperlipidemia not on statin therapy with lipid profile performed by her PCP 03/06/2022 revealing total cholesterol 302, LDL 202 and HDL of 81.  She apparently does have hepatic steatosis with mildly elevated transaminases.  Given her elevated coronary calcium score I prefer her LDL to be less than 70 for secondary prevention.  She would be a good candidate for PCSK9.  Will get our Pharm.D. to discuss and initiate.

## 2022-04-06 NOTE — Assessment & Plan Note (Signed)
Coronary calcium score performed 03/12/2022 was 142 the majority of which was in the LAD.  She is completely asymptomatic.  Based on this, we will be more aggressive with cardiac risk factor modification including lipid-lowering therapy.

## 2022-04-06 NOTE — Assessment & Plan Note (Signed)
Father had a myocardial infarction at age 51.

## 2022-04-06 NOTE — Progress Notes (Signed)
04/06/2022 ASTIN Hayes   1970/05/21  [id number]  Primary Physician Lisa Brunette, MD Primary Cardiologist: Lisa Gess MD Lisa Hayes, [name]  HPI:  Lisa Hayes is a 51 y.o. 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  She was referred by Lisa Hayes, her PCP, because of an elevated coronary calcium score along with hyperlipidemia.  Her cardiac risk factor profile is notable for hyper cholesterolemia.  She is not on statin therapy.  She does have hepatic steatosis with elevated transaminases followed by Dr. Elnoria Hayes.  She has no other cardiac risk factors other than family history.  Her father had a myocardial infarction at age 86.  She is never had a heart attack or stroke.  She denies chest pain or shortness of breath.  She has had a right nephrectomy in the past for unclear reasons but not because of cancer.  Coronary calcium score performed 03/12/2022 was 142 the majority of which was in the LAD territory.   Current Meds  Medication Sig   amphetamine-dextroamphetamine (ADDERALL XR) 30 MG 24 hr capsule Take 30 mg by mouth every morning.   valACYclovir (VALTREX) 500 MG tablet TAKE 4 TABLETS AT FIRST SIGN OF OUTBREAK, THEN TWELVE HOURS LATER, TAKE FOUR MORE TABLETS     No Known Allergies  Social History   Socioeconomic History   Marital status: Married    Spouse name: Not on file   Number of children: Not on file   Years of education: Not on file   Highest education level: Not on file  Occupational History   Not on file  Tobacco Use   Smoking status: Never   Smokeless tobacco: Never  Substance and Sexual Activity   Alcohol use: Yes    Comment: wine socially   Drug use: No   Sexual activity: Yes    Comment: husband had vasectomy  Other Topics Concern   Not on file  Social History Narrative   Not on file   Social Determinants of Health   Financial Resource Strain: Not  on file  Food Insecurity: Not on file  Transportation Needs: Not on file  Physical Activity: Not on file  Stress: Not on file  Social Connections: Not on file  Intimate Partner Violence: Not on file     Review of Systems: General: negative for chills, fever, night sweats or weight changes.  Cardiovascular: negative for chest pain, dyspnea on exertion, edema, orthopnea, palpitations, paroxysmal nocturnal dyspnea or shortness of breath Dermatological: negative for rash Respiratory: negative for cough or wheezing Urologic: negative for hematuria Abdominal: negative for nausea, vomiting, diarrhea, bright red blood per rectum, melena, or hematemesis Neurologic: negative for visual changes, syncope, or dizziness All other systems reviewed and are otherwise negative except as noted above.    Blood pressure 124/80, pulse 72, height 5\' 8"  (1.727 m), weight 230 lb 6.4 oz (104.5 kg), SpO2 98 %.  General appearance: alert and no distress Neck: no adenopathy, no carotid bruit, no JVD, supple, symmetrical, trachea midline, and thyroid not enlarged, symmetric, no tenderness/mass/nodules Lungs: clear to auscultation bilaterally Heart: regular rate and rhythm, S1, S2 normal, no murmur, click, rub or gallop Extremities: extremities normal, atraumatic, no cyanosis or edema Pulses: 2+ and symmetric Skin: Skin color, texture, turgor normal. No rashes or lesions Neurologic: Grossly normal  EKG sinus rhythm at 72 without ST or T wave changes.  Personally reviewed this  EKG.  ASSESSMENT AND PLAN:   Hyperlipidemia History of hyperlipidemia not on statin therapy with lipid profile performed by her PCP 03/06/2022 revealing total cholesterol 302, LDL 202 and HDL of 81.  She apparently does have hepatic steatosis with mildly elevated transaminases.  Given her elevated coronary calcium score I prefer her LDL to be less than 70 for secondary prevention.  She would be a good candidate for PCSK9.  Will get our  Pharm.D. to discuss and initiate.  Elevated coronary artery calcium score Coronary calcium score performed 03/12/2022 was 142 the majority of which was in the LAD.  She is completely asymptomatic.  Based on this, we will be more aggressive with cardiac risk factor modification including lipid-lowering therapy.  Family history of heart disease Father had a myocardial infarction at age 76.     Lisa Gess MD FACP,FACC,FAHA, [facility] 04/06/2022 8:24 AM

## 2022-04-10 ENCOUNTER — Ambulatory Visit (HOSPITAL_COMMUNITY)
Admission: RE | Admit: 2022-04-10 | Discharge: 2022-04-10 | Disposition: A | Discharge status: Home / Self Care | Payer: BC Managed Care – PPO | Source: Ambulatory Visit | Attending: Gastroenterology | Admitting: Gastroenterology

## 2022-04-10 DIAGNOSIS — R945 Abnormal results of liver function studies: Secondary | ICD-10-CM | POA: Diagnosis not present

## 2022-04-10 DIAGNOSIS — R7989 Other specified abnormal findings of blood chemistry: Secondary | ICD-10-CM | POA: Insufficient documentation

## 2022-04-11 ENCOUNTER — Encounter: Payer: Self-pay | Admitting: Cardiovascular Disease

## 2022-05-29 ENCOUNTER — Telehealth: Payer: Self-pay | Admitting: Pharmacist

## 2022-05-29 NOTE — Telephone Encounter (Signed)
Leqvio enrollment form faxed to [employer]

## 2022-05-29 NOTE — Telephone Encounter (Signed)
Leqvio webpage still not allowing me to enroll patient. Will send attachment via email

## 2022-06-13 ENCOUNTER — Other Ambulatory Visit (HOSPITAL_COMMUNITY): Payer: Self-pay

## 2022-06-15 ENCOUNTER — Other Ambulatory Visit: Payer: Self-pay | Admitting: Pharmacist

## 2022-06-15 ENCOUNTER — Telehealth: Payer: Self-pay | Admitting: Pharmacy Technician

## 2022-06-15 NOTE — Telephone Encounter (Addendum)
Lisa Hayes note:  Auth Submission: APPROVED Payer: [employer] Medication & CPT/J Code(s) submitted: Lisa Hayes) [phone] Route of submission (phone, fax, portal): PORTAL Phone # Fax # Auth type: Buy/Bill Units/visits requested: 3 Reference number: [id number] Approval from: 06/15/22 to 06/16/23  [employer] co-pay card: APPROVED ID: [id number] BIN: [id number] PCN: [id number]  Patient will be scheduled as soon as possible.

## 2022-06-18 ENCOUNTER — Encounter: Payer: Self-pay | Admitting: Cardiovascular Disease

## 2022-06-19 ENCOUNTER — Encounter: Payer: Self-pay | Admitting: Cardiovascular Disease

## 2022-06-25 ENCOUNTER — Ambulatory Visit (INDEPENDENT_AMBULATORY_CARE_PROVIDER_SITE_OTHER): Payer: 59

## 2022-06-25 VITALS — BP 125/84 | HR 66 | Temp 98.2°F | Resp 18 | Ht 67.0 in | Wt 222.0 lb

## 2022-06-25 DIAGNOSIS — E782 Mixed hyperlipidemia: Secondary | ICD-10-CM

## 2022-06-25 DIAGNOSIS — N135 Crossing vessel and stricture of ureter without hydronephrosis: Secondary | ICD-10-CM | POA: Diagnosis not present

## 2022-06-25 DIAGNOSIS — Z8249 Family history of ischemic heart disease and other diseases of the circulatory system: Secondary | ICD-10-CM | POA: Diagnosis not present

## 2022-06-25 DIAGNOSIS — R931 Abnormal findings on diagnostic imaging of heart and coronary circulation: Secondary | ICD-10-CM

## 2022-06-25 MED ORDER — INCLISIRAN SODIUM 284 MG/1.5ML ~~LOC~~ SOSY
284.0000 mg | PREFILLED_SYRINGE | Freq: Once | SUBCUTANEOUS | Status: AC
  Administered 2022-06-25: 284 mg via SUBCUTANEOUS
  Filled 2022-06-25: qty 1.5

## 2022-06-25 NOTE — Progress Notes (Signed)
Diagnosis: Hyperlipidemia  Provider:  Marshell Garfinkel MD  Procedure: Injection  Leqvio (inclisiran), Dose: 284 mg, Site: subcutaneous, Number of injections: 1  Post Care: Observation period completed  Discharge: Condition: Good, Destination: Home . AVS Declined  Performed by:  Fraser Din [place]-Burchett, RN

## 2022-07-10 ENCOUNTER — Ambulatory Visit: Payer: 59 | Attending: Cardiovascular Disease | Admitting: Cardiovascular Disease

## 2022-07-10 ENCOUNTER — Encounter: Payer: Self-pay | Admitting: Cardiovascular Disease

## 2022-07-10 VITALS — BP 122/78 | HR 75 | Ht 67.0 in | Wt 220.2 lb

## 2022-07-10 DIAGNOSIS — E782 Mixed hyperlipidemia: Secondary | ICD-10-CM | POA: Diagnosis not present

## 2022-07-10 DIAGNOSIS — R931 Abnormal findings on diagnostic imaging of heart and coronary circulation: Secondary | ICD-10-CM | POA: Diagnosis not present

## 2022-07-10 DIAGNOSIS — Z8249 Family history of ischemic heart disease and other diseases of the circulatory system: Secondary | ICD-10-CM

## 2022-07-10 NOTE — Patient Instructions (Signed)
Medication Instructions:  Your physician recommends that you continue on your current medications as directed. Please refer to the Current Medication list given to you today.  *If you need a refill on your cardiac medications before your next appointment, please call your pharmacy*   Lab Work: Your physician recommends that you return for lab work in: 3 months for FASTING lipid/liver panel  If you have labs (blood work) drawn today and your tests are completely normal, you will receive your results only by: [place] (if you have MyChart) OR A paper copy in the mail If you have any lab test that is abnormal or we need to change your treatment, we will call you to review the results.   Follow-Up: At [facility], you and your health needs are our priority.  As part of our continuing mission to provide you with exceptional heart care, we have created designated Provider Care Teams.  These Care Teams include your primary Cardiologist (physician) and Advanced Practice Providers (APPs -  Physician Assistants and Nurse Practitioners) who all work together to provide you with the care you need, when you need it.  We recommend signing up for the patient portal called "MyChart".  Sign up information is provided on this After Visit Summary.  MyChart is used to connect with patients for Virtual Visits (Telemedicine).  Patients are able to view lab/test results, encounter notes, upcoming appointments, etc.  Non-urgent messages can be sent to your provider as well.   To learn more about what you can do with MyChart, go to [email or web address].    Your next appointment:   6 month(s)  Provider:   Quay Burow, MD

## 2022-07-10 NOTE — Progress Notes (Signed)
Lisa Hayes returns today for follow-up.  She was approved for [facility] for treatment of hyperlipidemia with an LDL of 155 and elevated coronary calcium score.  She is completely asymptomatic.  Will recheck a lipid liver profile in 3 months and I will see her back in 6 months for follow-up.  Lorretta Harp, M.D., [place] [facility], [facility], [place] [address] [place], [place]  [address]  [phone] 07/10/2022 9:06 AM

## 2022-09-25 ENCOUNTER — Ambulatory Visit (INDEPENDENT_AMBULATORY_CARE_PROVIDER_SITE_OTHER): Payer: 59

## 2022-09-25 VITALS — BP 129/78 | HR 68 | Temp 98.2°F | Resp 16 | Ht 68.0 in | Wt 217.6 lb

## 2022-09-25 DIAGNOSIS — E782 Mixed hyperlipidemia: Secondary | ICD-10-CM | POA: Diagnosis not present

## 2022-09-25 DIAGNOSIS — N135 Crossing vessel and stricture of ureter without hydronephrosis: Secondary | ICD-10-CM

## 2022-09-25 DIAGNOSIS — Z8249 Family history of ischemic heart disease and other diseases of the circulatory system: Secondary | ICD-10-CM

## 2022-09-25 DIAGNOSIS — R931 Abnormal findings on diagnostic imaging of heart and coronary circulation: Secondary | ICD-10-CM

## 2022-09-25 MED ORDER — INCLISIRAN SODIUM 284 MG/1.5ML ~~LOC~~ SOSY
284.0000 mg | PREFILLED_SYRINGE | Freq: Once | SUBCUTANEOUS | Status: AC
  Administered 2022-09-25: 284 mg via SUBCUTANEOUS
  Filled 2022-09-25: qty 1.5

## 2022-09-25 NOTE — Progress Notes (Signed)
Diagnosis: Hyperlipidemia  Provider:  Chilton Greathouse MD  Procedure: Injection  Leqvio (inclisiran), Dose: 284 mg, Site: subcutaneous, Number of injections: 1  Post Care:  Patient Discharged  Discharge: Condition: Good, Destination: Home . AVS Declined  Performed by:  Marilynn Rail, RN

## 2022-10-24 ENCOUNTER — Telehealth: Payer: Self-pay | Admitting: Pharmacist Clinician (PhC)/ Clinical Pharmacy Specialist

## 2022-10-24 DIAGNOSIS — E782 Mixed hyperlipidemia: Secondary | ICD-10-CM

## 2022-10-24 NOTE — Telephone Encounter (Signed)
Leqvio labs ordered

## 2022-11-08 ENCOUNTER — Other Ambulatory Visit: Payer: Self-pay | Admitting: *Deleted

## 2022-11-08 DIAGNOSIS — E782 Mixed hyperlipidemia: Secondary | ICD-10-CM

## 2022-11-08 LAB — HEPATIC FUNCTION PANEL
ALT: 53 IU/L — ABNORMAL HIGH (ref 0–32)
AST: 36 IU/L (ref 0–40)
Albumin: 4.7 g/dL (ref 3.8–4.9)
Alkaline Phosphatase: 91 IU/L (ref 44–121)
Bilirubin Total: 0.6 mg/dL (ref 0.0–1.2)
Bilirubin, Direct: 0.16 mg/dL (ref 0.00–0.40)
Total Protein: 7.1 g/dL (ref 6.0–8.5)

## 2022-11-08 LAB — LIPID PANEL
Chol/HDL Ratio: 2.3 ratio (ref 0.0–4.4)
Cholesterol, Total: 165 mg/dL (ref 100–199)
HDL: 71 mg/dL (ref >39)
LDL Chol Calc (NIH): 73 mg/dL (ref 0–99)
Triglycerides: 120 mg/dL (ref 0–149)
VLDL Cholesterol Cal: 21 mg/dL (ref 5–40)

## 2023-01-08 ENCOUNTER — Ambulatory Visit: Payer: 59 | Admitting: Cardiovascular Disease

## 2023-03-27 ENCOUNTER — Ambulatory Visit: Payer: 59

## 2023-04-01 ENCOUNTER — Ambulatory Visit: Payer: 59

## 2023-04-01 VITALS — BP 136/84 | HR 83 | Temp 98.5°F | Resp 18 | Ht 68.0 in | Wt 227.8 lb

## 2023-04-01 DIAGNOSIS — N135 Crossing vessel and stricture of ureter without hydronephrosis: Secondary | ICD-10-CM

## 2023-04-01 DIAGNOSIS — R931 Abnormal findings on diagnostic imaging of heart and coronary circulation: Secondary | ICD-10-CM | POA: Diagnosis not present

## 2023-04-01 DIAGNOSIS — Z8249 Family history of ischemic heart disease and other diseases of the circulatory system: Secondary | ICD-10-CM | POA: Diagnosis not present

## 2023-04-01 DIAGNOSIS — E782 Mixed hyperlipidemia: Secondary | ICD-10-CM | POA: Diagnosis not present

## 2023-04-01 MED ORDER — INCLISIRAN SODIUM 284 MG/1.5ML ~~LOC~~ SOSY
284.0000 mg | PREFILLED_SYRINGE | Freq: Once | SUBCUTANEOUS | Status: AC
  Administered 2023-04-01: 284 mg via SUBCUTANEOUS
  Filled 2023-04-01: qty 1.5

## 2023-04-01 NOTE — Progress Notes (Signed)
Diagnosis: Hyperlipidemia  Provider:  Chilton Greathouse MD  Procedure: Injection  Leqvio (inclisiran), Dose: 284 mg, Site: subcutaneous, Number of injections: 1  Injection Site(s): Left arm  Post Care:  left arm injection  Discharge: Condition: Good, Destination: Home . AVS Declined  Performed by:  Rico Ala, LPN

## 2023-04-29 ENCOUNTER — Ambulatory Visit: Payer: 59 | Admitting: Cardiovascular Disease

## 2023-09-30 ENCOUNTER — Ambulatory Visit: Payer: 59

## 2023-10-03 ENCOUNTER — Ambulatory Visit

## 2023-10-03 ENCOUNTER — Telehealth: Payer: Self-pay | Admitting: Pharmacy Technician

## 2023-10-03 VITALS — BP 119/80 | HR 94 | Temp 99.3°F | Resp 16 | Ht 68.0 in | Wt 214.8 lb

## 2023-10-03 DIAGNOSIS — R931 Abnormal findings on diagnostic imaging of heart and coronary circulation: Secondary | ICD-10-CM | POA: Diagnosis not present

## 2023-10-03 DIAGNOSIS — N135 Crossing vessel and stricture of ureter without hydronephrosis: Secondary | ICD-10-CM

## 2023-10-03 DIAGNOSIS — E782 Mixed hyperlipidemia: Secondary | ICD-10-CM | POA: Diagnosis not present

## 2023-10-03 DIAGNOSIS — Z8249 Family history of ischemic heart disease and other diseases of the circulatory system: Secondary | ICD-10-CM

## 2023-10-03 MED ORDER — INCLISIRAN SODIUM 284 MG/1.5ML ~~LOC~~ SOSY
284.0000 mg | PREFILLED_SYRINGE | Freq: Once | SUBCUTANEOUS | Status: AC
  Administered 2023-10-03: 284 mg via SUBCUTANEOUS
  Filled 2023-10-03: qty 1.5

## 2023-10-03 NOTE — Telephone Encounter (Signed)
 Auth Submission: APPROVED Site of care: Site of care: [facility] Payer: [employer] Medication & CPT/J Code(s) submitted: Leqvio  (Inclisiran) J1306 Route of submission (phone, fax, portal): PORTAL Phone # Fax # Auth type: Buy/Bill PB Units/visits requested: 284MG  Q6MONTHS Reference number: [id number] Approval from: 10/03/23 to 10/02/24

## 2023-10-03 NOTE — Progress Notes (Signed)
 Diagnosis: Hyperlipidemia  Provider:  Chilton Greathouse MD  Procedure: Injection  Leqvio (inclisiran), Dose: 284 mg, Site: subcutaneous, Number of injections: 1  Injection Site(s): Right arm  Post Care:  right arm injection  Discharge: Condition: Good, Destination: Home . AVS Declined  Performed by:  Rico Ala, LPN

## 2024-02-14 ENCOUNTER — Other Ambulatory Visit: Payer: Self-pay | Admitting: Obstetrics and Gynecology

## 2024-02-14 DIAGNOSIS — R928 Other abnormal and inconclusive findings on diagnostic imaging of breast: Secondary | ICD-10-CM

## 2024-02-20 ENCOUNTER — Ambulatory Visit
Admission: RE | Admit: 2024-02-20 | Discharge: 2024-02-20 | Disposition: A | Discharge status: Home / Self Care | Source: Ambulatory Visit | Attending: Obstetrics and Gynecology | Admitting: Obstetrics and Gynecology

## 2024-02-20 ENCOUNTER — Other Ambulatory Visit: Payer: Self-pay | Admitting: Obstetrics and Gynecology

## 2024-02-20 DIAGNOSIS — R928 Other abnormal and inconclusive findings on diagnostic imaging of breast: Secondary | ICD-10-CM

## 2024-02-20 DIAGNOSIS — R921 Mammographic calcification found on diagnostic imaging of breast: Secondary | ICD-10-CM

## 2024-02-24 ENCOUNTER — Ambulatory Visit
Admission: RE | Admit: 2024-02-24 | Discharge: 2024-02-24 | Disposition: A | Discharge status: Home / Self Care | Source: Ambulatory Visit | Attending: Obstetrics and Gynecology | Admitting: Obstetrics and Gynecology

## 2024-02-24 DIAGNOSIS — R921 Mammographic calcification found on diagnostic imaging of breast: Secondary | ICD-10-CM

## 2024-02-24 HISTORY — PX: BREAST BIOPSY: SHX20

## 2024-02-25 LAB — SURGICAL PATHOLOGY

## 2024-04-03 ENCOUNTER — Ambulatory Visit

## 2024-04-03 VITALS — BP 119/81 | HR 68 | Temp 98.3°F | Resp 16 | Ht 68.0 in | Wt 197.8 lb

## 2024-04-03 DIAGNOSIS — R931 Abnormal findings on diagnostic imaging of heart and coronary circulation: Secondary | ICD-10-CM | POA: Diagnosis not present

## 2024-04-03 DIAGNOSIS — N135 Crossing vessel and stricture of ureter without hydronephrosis: Secondary | ICD-10-CM

## 2024-04-03 DIAGNOSIS — Z8249 Family history of ischemic heart disease and other diseases of the circulatory system: Secondary | ICD-10-CM

## 2024-04-03 DIAGNOSIS — E782 Mixed hyperlipidemia: Secondary | ICD-10-CM

## 2024-04-03 MED ORDER — INCLISIRAN SODIUM 284 MG/1.5ML ~~LOC~~ SOSY
284.0000 mg | PREFILLED_SYRINGE | Freq: Once | SUBCUTANEOUS | Status: AC
  Administered 2024-04-03: 284 mg via SUBCUTANEOUS
  Filled 2024-04-03: qty 1.5

## 2024-04-03 NOTE — Progress Notes (Signed)
 Diagnosis: Hyperlipidemia  Provider:  Chilton Greathouse MD  Procedure: Injection  Leqvio (inclisiran), Dose: 284 mg, Site: subcutaneous, Number of injections: 1  Injection Site(s): Left arm  Post Care:  left arm injection  Discharge: Condition: Good, Destination: Home . AVS Declined  Performed by:  Rico Ala, LPN

## 2024-10-06 ENCOUNTER — Ambulatory Visit
